# Patient Record
Sex: Female | Born: 2005 | Race: White | Hispanic: No | Marital: Single | Smoking: Never smoker
Health system: Southern US, Community
[De-identification: ages and names within clinical notes are randomized; demographics above are authoritative.]

## PROBLEM LIST (undated history)

## (undated) ENCOUNTER — Emergency Department (HOSPITAL_COMMUNITY): Payer: Self-pay | Source: Home / Self Care

## (undated) DIAGNOSIS — F329 Major depressive disorder, single episode, unspecified: Secondary | ICD-10-CM

## (undated) DIAGNOSIS — F909 Attention-deficit hyperactivity disorder, unspecified type: Secondary | ICD-10-CM

## (undated) DIAGNOSIS — F988 Other specified behavioral and emotional disorders with onset usually occurring in childhood and adolescence: Secondary | ICD-10-CM

## (undated) DIAGNOSIS — Q7963 Vascular Ehlers-Danlos syndrome: Secondary | ICD-10-CM

## (undated) DIAGNOSIS — J45990 Exercise induced bronchospasm: Secondary | ICD-10-CM

## (undated) HISTORY — PX: MENISCUS REPAIR: SHX5179

---
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
## 2024-01-02 ENCOUNTER — Telehealth (INDEPENDENT_AMBULATORY_CARE_PROVIDER_SITE_OTHER): Payer: Self-pay | Admitting: Child and Adolescent Psychiatry

## 2024-01-02 DIAGNOSIS — F649 Gender identity disorder, unspecified: Secondary | ICD-10-CM

## 2024-01-02 DIAGNOSIS — F418 Other specified anxiety disorders: Secondary | ICD-10-CM | POA: Diagnosis not present

## 2024-01-02 DIAGNOSIS — F39 Unspecified mood [affective] disorder: Secondary | ICD-10-CM

## 2024-01-02 NOTE — Progress Notes (Signed)
 Virtual Visit via Video Note  I connected with Samantha [name] on 01/02/24 at 10:30 AM EDT by a video enabled telemedicine application and verified that I am speaking with the correct person using two identifiers.  Location: Patient: home Provider: office   I discussed the limitations of evaluation and management by telemedicine and the availability of in person appointments. The patient expressed understanding and agreed to proceed.   I discussed the assessment and treatment plan with the patient. The patient was provided [name] opportunity to ask questions and all were answered. The patient agreed with the plan and demonstrated [name] understanding of the instructions.   The patient was advised to call back or seek [name] in-person evaluation if the symptoms worsen or if the condition fails to improve as anticipated.     Samantha Smalling, MD   [facility] MD/PA/NP OP Progress Note  01/02/24 3:00 PM Samantha [name]  MRN:  [id number]  Chief Complaint: Medication management follow-up.  Synopsis: This is [name] 18 year old Caucasian assigned female at birth, identifying self as nonbinary and prefers pronoun "they/them", and now prefers to be called "Samantha [name]" domiciled with biological parents and siblings.  They are currently prescribed lamotrigine 50 mg twice daily, trazodone 50 mg at bedtime, BuSpar 10 mg twice a day and Lexapro 20 mg once a day. Past med trials - clonidine for tics caused sedation and no improvement, Abilify (no improvement with tics, but helped with mood, caused weight gain).   HPI:   Samantha [name] was seen and evaluated over telemedicine encounter for medication management for her.  They reported that they have been doing well, they are finishing their freshman year of college, has done most of the schoolwork and therefore the next 2 weeks will be easier and then they will have 3 months of break during which they will move back with their parents.  They reported that their mood has been good,  except occasional situational depressed mood or anxiety, mood and anxiety has stayed stable.  He denied problems with sleep, they are eating better, has improvement with appetite, denied any stomach problems, denied any SI or HI.  They reported that they are doing well socially, they have a good group of friends, also in a relationship with the girlfriend who lives in [place].  They reported that they often take BuSpar 2 times daily because of the classes and despite that their anxiety has stayed stable before we discussed to reduce the dose of BuSpar to 2 times daily.  We discussed to continue with the starter current medications because of the stability with symptoms.  They will follow-up again in about 3 months or earlier if needed.   Visit Diagnosis:    ICD-10-CM   1. Mood disorder (HCC)  F39     2. Other specified anxiety disorders  F41.8 Ambulatory Referral to Primary Care (Establish Care)    3. Gender dysphoria  F64.9                    Past Psychiatric History: One past psychiatric hospitalization, past med trial include Prozac, and clonidine(made them zombie), Abilify - weight gain and not effective for tics, Currently seeing Samantha [name] at [facility] for ind therapy  Psychological evaluation from 06/10/21  "Clinician: Robb Matar, PhD, Licensed Psychologist, Pediatric Neuropsychologist  Reason for Referral   Samantha [name] is a 66 year old right-handed teenager with a history of concern about the development of their attentional system. Additionally, the have diagnoses of anxiety, depression,  obsessive compulsive disorder, Tourette syndrome, and Ehlers Danlos syndrome. Samantha also has some curiosity if they may also have [name] autism spectrum disorder. They were referred for neuropsychological testing due to a long history of difficulty with task completion at home and at school, causing functioning impairment in both arenas.   Key  Findings   Note: Rather than discussing every score, the following is a synopsis of the most important findings from testing. Please refer to the appendix at the end of this report for a complete list of tests administered and their associated scores.  Samantha's profile and history are consistent with attention deficit hyperactivity disorder (ADHD). Many of the stressors at school and at home were reportedly associated with disorganization, poor task completion, and emotional overwhelm. These are all suggestive of difficulty with real world planning, organization, working memory and emotional regulation associated with ADHD. Despite intact performance on several executive functioning measures during assessment, parent ratings real world skill utilization of inhibition, emotional control, working memory, Optician, dispensing, task-monitor, and organization of materials were significantly elevated. As for history of symptoms, Samantha has always been labeled "chatty" at school and Ms. Catalina reported that as a child, they were always on the go. Symptoms of hyperactivity seem to be long standing.   Samantha's presentation and history is also notable a complex constellation of psychiatric symptoms. They report feeling like they are in [name] improved place regarding mood and are currently in counseling and taking several medications. Previously reported "functional collapsing" episodes continue to occur frequently at school and home. Strategies are in place currently and Samantha [name] is on a wait list for treatment out of state to address these. Today, the focus of the evaluation was difficulties impacting learning. ADHD appears to be the most significant factor currently not being addressed. That said, Samantha reports ongoing symptoms of OCD, Anxiety and Tourette syndrome, which often co-occur. Functional impact of these symptoms appears to be manageable with current treatments.   Despite these challenges, Samantha also exhibited  several strengths within their neuropsychological profile.   Intelligence: Samantha's intelligence quotient is overall average, with high average verbal intelligence.    Executive skill demonstration: Samantha [name] demonstrated average to above set shifting, inhibition, and deductive reasoning on assessment tasks. While they don't demonstrat these skills in day to day living, the presence of this skill set suggests that when distractions are limited and the task is outlined and clear, Samantha can be expected to have more success accessing their neurocognitive strengths.   Conclusions   Taken together, Samantha [name] demonstrated age appropriate or better skills on direct assessment today with elevated ratings of their attention and executive functioning across home and school settings. There is no indication on testing of cognitive or learning disorders that could be impacting their success at school. 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 As discussed during the evaluation, the questions of [name] autism spectrum disorder is best evaluated by another clinic and those recommendations are below.   Given the complexity of Samantha's current psychological presentation and ongoing treatment, I am hopeful this additional information with be another important place for intervention and improvement in functioning. It is possible that significant stress and dysfunction from symptoms of ADHD is impacting their overall psychological functioning. It is my hope that with appropriate intervention, Samantha [name] will see improved functioning globally.   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   Family History:  Family History  Problem Relation Age of Onset   Anxiety disorder Mother    Hyperlipidemia Father     Social History:  Social History   Socioeconomic History   Marital status: Single    Spouse name: Not on file   Number of children: Not on file   Years of education: Not on file   Highest education level: Not on file  Occupational History   Not on file  Tobacco Use   Smoking status: Never    Passive exposure: Never   Smokeless tobacco: Never  Vaping Use   Vaping status: Never Used  Substance and Sexual Activity   Alcohol use: No   Drug use: Yes    Types: Marijuana    Comment: Socially   Sexual activity: Not on file  Other Topics Concern   Not on file  Social History Narrative   Not on file   Social Drivers of Health   Financial Resource Strain: Not on file  Food Insecurity: Not on file  Transportation Needs: Not on file  Physical Activity: Not on file  Stress: Not on file  Social Connections: Not on file    Allergies: No Known Allergies  Metabolic Disorder Labs: Lab Results  Component Value Date   HGBA1C 5.3 12/17/2019   No results found for: "PROLACTIN" Lab Results  Component Value Date   CHOL 191 (H) 12/17/2019   TRIG 96 (H) 12/17/2019   HDL 46 12/17/2019   CHOLHDL 4.2 12/17/2019   LDLCALC 128 (H) 12/17/2019   Lab Results  Component Value Date   TSH 1.330 12/17/2019    Therapeutic Level Labs: No results found for: "LITHIUM" No results found for: "VALPROATE" No results found for: "CBMZ"  Current Medications: Current Outpatient Medications  Medication Sig Dispense Refill   busPIRone (BUSPAR) 10 MG tablet Take 1 tablet (10 mg total) by mouth 3 (three) times daily. 270 tablet 1   escitalopram  (LEXAPRO) 20 MG tablet Take 1 tablet (20 mg total) by mouth daily. 90 tablet 1   hydrOXYzine (ATARAX) 25 MG tablet Take 0.5-1 tablets (12.5-25 mg total) by mouth 3 (three) times daily as needed for anxiety. 30 tablet 0   lamoTRIgine (LAMICTAL) 25 MG tablet Take 2 tablets (50 mg total) by mouth 2 (two) times daily. 360 tablet 1   Riboflavin-Magnesium-Feverfew (MIGRELIEF PO) Take 1 tablet by mouth daily.     traZODone (DESYREL) 50 MG tablet TAKE 1 TABLET BY MOUTH EVERYDAY AT BEDTIME 90 tablet 1   XULANE 150-35 MCG/24HR transdermal patch APPLY 1 PATCH AS INSTRUCTED FOR 7 DAYS. USE FOR 3 WEEKS IN A ROW, THEN 1 WEEK BREAK.     No current facility-administered medications for this visit.     Musculoskeletal: Strength & Muscle Tone: unable to assess since visit was over the telemedicine. Gait & Station: unable to assess since visit was over the telemedicine. Patient leans: N/A  Psychiatric Specialty Exam: Review of 12 systems negative except as mentioned in HPI   Mental Status Exam: Appearance: casually dressed; well groomed; no overt signs  of trauma or distress noted Attitude: calm, cooperative with good eye contact Activity: No PMA/PMR, no tics/no tremors; no EPS noted  Speech: normal rate, rhythm and volume Thought Process: Logical, linear, and goal-directed.  Associations: no looseness, tangentiality, circumstantiality, flight of ideas, thought blocking or word salad noted Thought Content: (abnormal/psychotic thoughts): no abnormal or delusional thought process evidenced SI/HI: denies Si/Hi Perception: no illusions or visual/auditory hallucinations noted; no response to internal stimuli demonstrated Mood & Affect: "good"/full range, neutral Judgment & Insight: both fair Attention and Concentration : Good Cognition : WNL Language : Good ADL - Intact    Screenings: GAD-7    Flowsheet Row Video Visit from 05/05/2021 in [facility]  Total  GAD-7 Score 12      PHQ2-9    Flowsheet Row Video Visit from 05/05/2021 in [facility]  PHQ-2 Total Score 3  PHQ-9 Total Score 16      Flowsheet Row ED from 06/09/2023 in [facility] at [facility] ED from 05/11/2023 in [facility] at [facility] ED from 09/06/2021 in [facility]   C-SSRS RISK CATEGORY High Risk High Risk No Risk        Assessment and Plan:   18 year old AFAB, identifies as non binary, prefers pronoun "they/them" with anxiety, mood disorder (most likely bipolar 2 disorder vs MDD), Gender Dysphoria, OCD, Tourette's Syndrome, Sleeping difficulties, one previous psychiatric hospitalization in the context of SI with plan in 2019 currently on Lexapro 20 mg daily, Trazodone 50 mg QHS, Lamictal 50 mg BID, Buspar 10 mg BIDand in weekly to once every other week ind therapy. Neuropsychological evaluation revealed a new diagnosis of ADHD.  Patient is doing well academically and parents are not interested in medication treatment for ADHD.  They have hx of  fainting spells/seizure like activity which now seem to occur very less.  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    Update on 01/02/24  -  Reviewed response to the current medications and they appear to have continued stability with mood and anxiety, tics are stable.recommending to continue with current medications as mentioned regarding the plan.      Plan was reviewed on 01/02/24     #1 Anxiety/OCD (chronic, stable) - Continue Lexapro 20 mg daily - Continue with ind therapy at [facility]. Sees them every 2 weeks - Continue with Buspar 10mg  three times daily. - Continue with Atarax(hydroxyzine) 12.5-25 mg three times daily as needed for anxiety       #2 Mood/Gender Dysphoria  (chronic,stable)  - Continue with Lamictal  50 mg BID.  - Continue with Lexapro 20 mg daily as mentioned above.  - Therapy as mentioned above. Also recommended family therapy once every 3-4 weeks with current therapist.   #3 Tics (chronic, stable) - Tics are better - Continue with therapy  #4 Sleeping difficulties (chronic, stable) - Continue Trazodone 50 mg QHS for sleep.         This note was generated in part or whole with voice recognition software. Voice recognition is usually quite accurate but there are transcription errors that can and very often do occur. I apologize for any typographical errors that were not detected and corrected.   MDM = 2 or more chronic stable conditions + med management  Pilar Bridge, MD 01/02/2024, 10:56 AM

## 2024-04-05 ENCOUNTER — Telehealth: Admitting: Child and Adolescent Psychiatry

## 2024-04-09 ENCOUNTER — Telehealth (INDEPENDENT_AMBULATORY_CARE_PROVIDER_SITE_OTHER): Admitting: Child and Adolescent Psychiatry

## 2024-04-09 DIAGNOSIS — F418 Other specified anxiety disorders: Secondary | ICD-10-CM | POA: Diagnosis not present

## 2024-04-09 DIAGNOSIS — F39 Unspecified mood [affective] disorder: Secondary | ICD-10-CM | POA: Diagnosis not present

## 2024-04-09 DIAGNOSIS — F5105 Insomnia due to other mental disorder: Secondary | ICD-10-CM | POA: Diagnosis not present

## 2024-04-09 DIAGNOSIS — F99 Mental disorder, not otherwise specified: Secondary | ICD-10-CM

## 2024-04-09 DIAGNOSIS — F649 Gender identity disorder, unspecified: Secondary | ICD-10-CM

## 2024-04-09 MED ORDER — ESCITALOPRAM OXALATE 20 MG PO TABS
20.0000 mg | ORAL_TABLET | Freq: Every day | ORAL | 1 refills | Status: DC
Start: 2024-04-09 — End: 2024-07-13

## 2024-04-09 MED ORDER — LAMOTRIGINE 25 MG PO TABS: 50.0000 mg | ORAL_TABLET | Freq: Two times a day (BID) | ORAL | 1 refills | Status: DC

## 2024-04-09 MED ORDER — BUSPIRONE HCL 10 MG PO TABS
10.0000 mg | ORAL_TABLET | Freq: Three times a day (TID) | ORAL | 1 refills | Status: DC
Start: 2024-04-09 — End: 2024-05-24

## 2024-04-09 MED ORDER — TRAZODONE HCL 50 MG PO TABS: ORAL_TABLET | ORAL | 1 refills | Status: DC

## 2024-04-09 NOTE — Progress Notes (Signed)
 Virtual Visit via Telephone Note  I connected with Samantha [name] on 04/09/24 at  2:00 PM EDT by telephone and verified that I am speaking with the correct person using two identifiers.  Location: Patient:  Provider: Office   I discussed the limitations, risks, security and privacy concerns of performing an evaluation and management service by telephone and the availability of in person appointments. I also discussed with the patient that there may be a patient responsible charge related to this service. The patient expressed understanding and agreed to proceed.    I discussed the assessment and treatment plan with the patient. The patient was provided an opportunity to ask questions and all were answered. The patient agreed with the plan and demonstrated an understanding of the instructions.   The patient was advised to call back or seek an in-person evaluation if the symptoms worsen or if the condition fails to improve as anticipated.  I provided 15 minutes of non-face-to-face time during this encounter.   Shelton CHRISTELLA [name], MD    [facility] MD/PA/NP OP Progress Note  04/09/24 3:00 PM Samantha [name]  MRN:  [id number]  Chief Complaint: Medication management follow-up.  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  They are currently prescribed lamotrigine  50 mg twice daily, trazodone  50 mg at bedtime, BuSpar  10 mg twice a day and Lexapro  20 mg once a day. Past med trials - clonidine  for tics caused sedation and no improvement, Abilify  (no improvement with tics, but helped with mood, caused weight gain).   HPI:   [name] was evaluated over telephone encounter for medication management follow-up.  They were out-of-network and therefore could not connect on the video therefore appointment was on the telephone.  They  denied any new concerns for today's appointment, reported that they have been doing well with regards of mood, denied excessive worries or anxiety, reported that they have been sleeping well, denied problems with appetite, denied any new psychosocial stressors.  They have been enjoying time off at home, with their parents.  He denies any SI or HI.  Medically they reported that they believe they have another meniscal tear, and therefore having pain in their knee.  They have MRI scheduled in August, denied any other medical issues.  They reported that they have been taking their medications consistently without any problems.  We discussed to continue with them and follow up again in about 3 to 4 months or earlier if needed.    Visit Diagnosis:    ICD-10-CM   1. Mood disorder (HCC)  F39     2. Other specified anxiety disorders  F41.8 busPIRone  (BUSPAR ) 10 MG tablet    escitalopram  (LEXAPRO ) 20 MG tablet    3. Gender dysphoria  F64.9 escitalopram  (LEXAPRO ) 20 MG tablet    4. Insomnia due to other mental disorder  F51.05 traZODone  (DESYREL ) 50 MG tablet   F99                     Past Psychiatric History: One past psychiatric hospitalization, past med trial include Prozac, and clonidine (made them zombie), Abilify  - weight gain and not effective for tics, Currently seeing Ms. Derry Bon at [facility] for ind therapy  Psychological evaluation from 06/10/21  Clinician: Chiquita Dawn, PhD, Licensed Psychologist, Pediatric Neuropsychologist  Reason for Referral   Samantha [name]) Markovic is a 18 year old  right-handed teenager with a history of concern about the development of their attentional system. Additionally, the have diagnoses of anxiety, depression, obsessive compulsive disorder, Tourette syndrome, and Ehlers Danlos syndrome. Samantha also has some curiosity if they may also have an autism spectrum disorder. They were referred for neuropsychological testing  due to a long history of difficulty with task completion at home and at school, causing functioning impairment in both arenas.   Key Findings   Note: Rather than discussing every score, the following is a synopsis of the most important findings from testing. Please refer to the appendix at the end of this report for a complete list of tests administered and their associated scores.  Samantha's profile and history are consistent with attention deficit hyperactivity disorder (ADHD). Many of the stressors at school and at home were reportedly associated with disorganization, poor task completion, and emotional overwhelm. These are all suggestive of difficulty with real world planning, organization, working memory and emotional regulation associated with ADHD. Despite intact performance on several executive functioning measures during assessment, parent ratings real world skill utilization of inhibition, emotional control, working memory, Optician, dispensing, task-monitor, and organization of materials were significantly elevated. As for history of symptoms, Samantha has always been labeled "chatty" at school and Ms. Mahmood reported that as a child, they were always on the go. Symptoms of hyperactivity seem to be long standing.   Samantha's presentation and history is also notable a complex constellation of psychiatric symptoms. They report feeling like they are in an improved place regarding mood and are currently in counseling and taking several medications. Previously reported "functional collapsing" episodes continue to occur frequently at school and home. Strategies are in place currently and Samantha is on a wait list for treatment out of state to address these. Today, the focus of the evaluation was difficulties impacting learning. ADHD appears to be the most significant factor currently not being addressed. That said, Samantha reports ongoing symptoms of OCD, Anxiety and Tourette syndrome, which often co-occur.  Functional impact of these symptoms appears to be manageable with current treatments.   Despite these challenges, Samantha also exhibited several strengths within their neuropsychological profile.   Intelligence: Samantha's intelligence quotient is overall average, with high average verbal intelligence.    Executive skill demonstration: [name] demonstrated average to above set shifting, inhibition, and deductive reasoning on assessment tasks. While they don't demonstrat these skills in day to day living, the presence of this skill set suggests that when distractions are limited and the task is outlined and clear, Samantha can be expected to have more success accessing their neurocognitive strengths.   Conclusions   Taken together, [name] demonstrated age appropriate or better skills on direct assessment today with elevated ratings of their attention and executive functioning across home and school settings. There is no indication on testing of cognitive or learning disorders that could be impacting their success at school. 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 As discussed during the evaluation, the questions of an autism spectrum disorder is best evaluated by another clinic and those recommendations are below.   Given the complexity of Samantha's current psychological presentation and ongoing treatment, I am hopeful this additional information with be another important place for intervention and improvement in functioning. It is possible that significant stress and dysfunction from symptoms of ADHD is impacting their overall psychological functioning. It is my hope that with appropriate intervention, [name] will see improved functioning globally.   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   Family History:  Family History  Problem Relation Age of Onset   Anxiety disorder Mother    Hyperlipidemia Father     Social History:  Social History   Socioeconomic History   Marital status: Single    Spouse name: Not on file   Number of children: Not on file   Years of education: Not on file   Highest education level: Not on file  Occupational History   Not on file  Tobacco Use   Smoking status: Never    Passive exposure: Never   Smokeless tobacco: Never  Vaping Use   Vaping status: Never Used  Substance and Sexual Activity   Alcohol use: No   Drug use: Yes    Types: Marijuana    Comment: Socially   Sexual activity: Not on file  Other Topics Concern   Not on file  Social History Narrative   Not on file   Social Drivers of Health   Financial Resource Strain: Not on file  Food Insecurity: Not on file  Transportation Needs: Not on file  Physical Activity: Not on file  Stress: Not on file  Social Connections: Not on file    Allergies: No Known Allergies  Metabolic Disorder Labs: Lab Results  Component Value Date   HGBA1C 5.3 12/17/2019   No results found for: PROLACTIN Lab Results  Component Value Date   CHOL 191 (H) 12/17/2019   TRIG 96 (H) 12/17/2019   HDL 46 12/17/2019   CHOLHDL 4.2 12/17/2019   LDLCALC 128 (H) 12/17/2019   Lab Results  Component Value Date   TSH 1.330 12/17/2019    Therapeutic Level Labs: No results found for: LITHIUM No results found for: VALPROATE No results found for: CBMZ  Current Medications: Current Outpatient Medications  Medication Sig  Dispense Refill   busPIRone  (BUSPAR ) 10 MG tablet Take 1 tablet (10 mg total) by mouth 3 (three) times daily. 270 tablet 1   escitalopram  (LEXAPRO ) 20 MG tablet Take 1 tablet (20 mg total) by mouth daily. 90 tablet 1   hydrOXYzine  (ATARAX ) 25 MG tablet Take 0.5-1 tablets (12.5-25 mg total) by mouth 3 (three) times daily as needed for anxiety. 30 tablet 0   lamoTRIgine  (LAMICTAL ) 25 MG tablet Take 2 tablets (50 mg total) by mouth 2 (two) times daily. 360 tablet 1   Riboflavin-Magnesium-Feverfew (MIGRELIEF PO) Take 1 tablet by mouth daily.     traZODone  (DESYREL ) 50 MG tablet TAKE 1 TABLET BY MOUTH EVERYDAY AT BEDTIME 90 tablet 1   XULANE 150-35 MCG/24HR transdermal patch APPLY 1 PATCH AS INSTRUCTED FOR 7 DAYS. USE FOR 3 WEEKS IN A ROW, THEN 1 WEEK BREAK.     No current facility-administered medications for this visit.     Musculoskeletal: Strength & Muscle Tone: unable to assess since visit was over the telemedicine. Gait & Station: unable to assess since visit was over the telemedicine. Patient leans: N/A  Psychiatric Specialty Exam: Review of  12 systems negative except as mentioned in HPI   Mental Status Exam: Appearance: casually dressed; well groomed; no overt signs of trauma or distress noted Attitude: calm, cooperative with good eye contact Activity: No PMA/PMR, no tics/no tremors; no EPS noted  Speech: normal rate, rhythm and volume Thought Process: Logical, linear, and goal-directed.  Associations: no looseness, tangentiality, circumstantiality, flight of ideas, thought blocking or word salad noted Thought Content: (abnormal/psychotic thoughts): no abnormal or delusional thought process evidenced SI/HI: denies Si/Hi Perception: no illusions or visual/auditory hallucinations noted; no response to internal stimuli demonstrated Mood & Affect: good/full range, neutral Judgment & Insight: both fair Attention and Concentration : Good Cognition : WNL Language : Good ADL - Intact     Screenings: GAD-7    Flowsheet Row Video Visit from 05/05/2021 in [facility]  Total GAD-7 Score 12   PHQ2-9    Flowsheet Row Video Visit from 05/05/2021 in [facility]  PHQ-2 Total Score 3  PHQ-9 Total Score 16   Flowsheet Row ED from 06/09/2023 in [facility] at [facility] ED from 05/11/2023 in [facility] from 09/06/2021 in [facility] at [facility]   C-SSRS RISK CATEGORY High Risk High Risk No Risk     Assessment and Plan:   18 year old AFAB, identifies as non binary, prefers pronoun they/them with anxiety, mood disorder (most likely bipolar 2 disorder vs MDD), Gender Dysphoria, OCD, Tourette's Syndrome, Sleeping difficulties, one previous psychiatric hospitalization in the context of SI with plan in 2019 currently on Lexapro  20 mg daily, Trazodone  50 mg QHS, Lamictal  50 mg BID, Buspar  10 mg BIDand in weekly to once every other week ind therapy. Neuropsychological evaluation revealed a new diagnosis of ADHD.  Patient is doing well academically and parents are not interested in medication treatment for ADHD.  They have hx of  fainting spells/seizure like activity which now seem to occur very less.  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    Update on 04/09/24  -  Reviewed response to the current medications and they appear to have continued stability with mood and anxiety, recommending to continue the current medications and follow up again in about 3 to 4 months or earlier as needed.      Plan was reviewed on 04/09/24     #1 Anxiety/OCD (chronic, stable) - Continue Lexapro  20 mg daily - Continue with ind therapy at [facility]. Sees them every 2 weeks - Continue with Buspar  10mg  three  times daily. - Continue with Atarax (hydroxyzine ) 12.5-25 mg three times daily as needed for anxiety       #2 Mood/Gender Dysphoria (chronic,stable)  - Continue with Lamictal   50 mg BID.  - Continue with Lexapro  20 mg daily as mentioned above.  - Therapy as mentioned above. Also recommended family therapy once every 3-4 weeks with current therapist.   #3 Tics (chronic, stable) - Tics are better - Continue with therapy  #4 Sleeping difficulties (chronic, stable) - Continue Trazodone  50 mg QHS for sleep.         This note was generated in part or whole with voice recognition software. Voice recognition is usually quite accurate but there are transcription errors that can and very often do occur. I apologize for any typographical errors that were not detected and corrected.  MDM = 2 or more chronic stable conditions + med management         Shelton CHRISTELLA [name], MD 04/09/2024, 2:24 PM

## 2024-05-18 ENCOUNTER — Ambulatory Visit: Admitting: Nurse Practitioner

## 2024-05-18 ENCOUNTER — Encounter: Payer: Self-pay | Admitting: Nurse Practitioner

## 2024-05-18 VITALS — BP 98/62 | HR 70 | Resp 16 | Ht 62.75 in | Wt 172.5 lb

## 2024-05-18 DIAGNOSIS — Z1159 Encounter for screening for other viral diseases: Secondary | ICD-10-CM

## 2024-05-18 DIAGNOSIS — F952 Tourette's disorder: Secondary | ICD-10-CM

## 2024-05-18 DIAGNOSIS — F4323 Adjustment disorder with mixed anxiety and depressed mood: Secondary | ICD-10-CM

## 2024-05-18 DIAGNOSIS — E282 Polycystic ovarian syndrome: Secondary | ICD-10-CM

## 2024-05-18 DIAGNOSIS — F39 Unspecified mood [affective] disorder: Secondary | ICD-10-CM | POA: Diagnosis not present

## 2024-05-18 DIAGNOSIS — Z131 Encounter for screening for diabetes mellitus: Secondary | ICD-10-CM

## 2024-05-18 DIAGNOSIS — Z8249 Family history of ischemic heart disease and other diseases of the circulatory system: Secondary | ICD-10-CM | POA: Insufficient documentation

## 2024-05-18 DIAGNOSIS — Z13220 Encounter for screening for lipoid disorders: Secondary | ICD-10-CM

## 2024-05-18 DIAGNOSIS — Z114 Encounter for screening for human immunodeficiency virus [HIV]: Secondary | ICD-10-CM

## 2024-05-18 DIAGNOSIS — G90A Postural orthostatic tachycardia syndrome (POTS): Secondary | ICD-10-CM

## 2024-05-18 DIAGNOSIS — F902 Attention-deficit hyperactivity disorder, combined type: Secondary | ICD-10-CM | POA: Insufficient documentation

## 2024-05-18 DIAGNOSIS — S83232A Complex tear of medial meniscus, current injury, left knee, initial encounter: Secondary | ICD-10-CM | POA: Insufficient documentation

## 2024-05-18 DIAGNOSIS — Z130 Encounter for screening for diseases of the blood and blood-forming organs and certain disorders involving the immune mechanism: Secondary | ICD-10-CM

## 2024-05-18 DIAGNOSIS — Q7963 Vascular Ehlers-Danlos syndrome: Secondary | ICD-10-CM

## 2024-05-18 DIAGNOSIS — S83232S Complex tear of medial meniscus, current injury, left knee, sequela: Secondary | ICD-10-CM

## 2024-05-18 NOTE — Progress Notes (Signed)
 BP 98/62   Pulse 70   Resp 16   Ht 5' 2.75 (1.594 m)   Wt 172 lb 8 oz (78.2 kg)   SpO2 100%   BMI 30.80 kg/m    Subjective:    Patient ID: Samantha [name], adult    DOB: 27-May-2006, 18 y.o.   MRN: [id number]  HPI: Samantha [name] is a 18 y.o. adult  Chief Complaint  Patient presents with   Establish Care    Req handicap due to meniscus    Discussed the use of AI scribe software for clinical note transcription with the patient, who gave verbal consent to proceed.  History of Present Illness Samantha [name] is an 18 year old who presents to establish care.  Neuropsychiatric symptoms - Tourette's disorder, mood disorder, and adjustment disorder managed with Buspar  10 mg three times daily, Lexapro  20 mg daily, Lamictal  50 mg twice daily, hydroxyzine  12.5 to 25 mg three times daily as needed, and trazodone  50 mg at bedtime - Currently under psychiatric and behavioral health care at [facility] - Currently seeing a psychiatrist and therapist  Left knee pain and suspected meniscal injury - Under orthopedic care for suspected medial meniscus tear in the left knee - MRI did not confirm a meniscal tear - Scheduled for arthroscopic procedure for further evaluation and potential treatment  Connective tissue disease and associated symptoms - Diagnosed with Ehlers-Danlos syndrome in 2021 or 2022 - Chronic syncopal episodes and mobility issues attributed to Ehlers-Danlos syndrome - Symptoms of postural orthostatic tachycardia syndrome (POTS) - Family history of Ehlers-Danlos syndrome affecting mother and sister, but not brother - Family history of hereditary aneurysms affecting mother and sister; great grandmother died from aneurysm  Gynecologic and endocrine symptoms - IUD inserted under sedation earlier this week - History of irregular menstrual periods - Previously evaluated for polycystic ovary syndrome (PCOS), which was not confirmed at that time - Recent findings during  IUD insertion suggest possible PCOS - Currently under care of an OBGYN  Care coordination - Seeking to connect with a cardiologist and neurologist for further evaluation of their conditions         05/18/2024    2:20 PM 05/05/2021   10:19 AM  Depression screen PHQ 2/9  Decreased Interest 0   Down, Depressed, Hopeless 0   PHQ - 2 Score 0   Altered sleeping 0   Tired, decreased energy 0   Change in appetite 0   Feeling bad or failure about yourself  0   Trouble concentrating 0   Moving slowly or fidgety/restless 0   Suicidal thoughts 0   PHQ-9 Score 0   Difficult doing work/chores Not difficult at all      Information is confidential and restricted. Go to Review Flowsheets to unlock data.       05/18/2024    2:20 PM 05/05/2021   10:24 AM  GAD 7 : Generalized Anxiety Score  Nervous, Anxious, on Edge 0   Control/stop worrying 0   Worry too much - different things 0   Trouble relaxing 0   Restless 0   Easily annoyed or irritable 0   Afraid - awful might happen 0   Total GAD 7 Score 0   Anxiety Difficulty Not difficult at all      Information is confidential and restricted. Go to Review Flowsheets to unlock data.     Relevant past medical, surgical, family and social history reviewed and updated as indicated. Interim medical history since our last  visit reviewed. Allergies and medications reviewed and updated.  Review of Systems  Constitutional: Negative for fever or weight change.  Respiratory: Negative for cough and shortness of breath.   Cardiovascular: Negative for chest pain or palpitations.  Gastrointestinal: Negative for abdominal pain, no bowel changes.  Musculoskeletal: positive for gait problem or joint swelling.  Skin: Negative for rash.  Neurological: Negative for dizziness or headache.  No other specific complaints in a complete review of systems (except as listed in HPI above).      Objective:     BP 98/62   Pulse 70   Resp 16   Ht 5' 2.75  (1.594 m)   Wt 172 lb 8 oz (78.2 kg)   SpO2 100%   BMI 30.80 kg/m    Wt Readings from Last 3 Encounters:  05/18/24 172 lb 8 oz (78.2 kg) (93%, Z= 1.50)*  06/09/23 167 lb (75.8 kg) (93%, Z= 1.44)*  05/11/23 172 lb 6.4 oz (78.2 kg) (94%, Z= 1.56)*   * Growth percentiles are based on CDC (Girls, 2-20 Years) data.    Physical Exam Physical Exam GENERAL: Alert, cooperative, well developed, no acute distress HEENT: Normocephalic, normal oropharynx, moist mucous membranes CHEST: Clear to auscultation bilaterally, no wheezes, rhonchi, or crackles CARDIOVASCULAR: Normal heart rate and rhythm, S1 and S2 normal without murmurs ABDOMEN: Soft, non-tender, non-distended, without organomegaly, normal bowel sounds EXTREMITIES: No cyanosis or edema NEUROLOGICAL: Cranial nerves grossly intact, moves all extremities without gross motor or sensory deficit   Results for orders placed or performed during the hospital encounter of 05/11/23  Comprehensive metabolic panel   Collection Time: 05/11/23 11:16 AM  Result Value Ref Range   Sodium 138 135 - 145 mmol/L   Potassium 3.8 3.5 - 5.1 mmol/L   Chloride 103 98 - 111 mmol/L   CO2 24 22 - 32 mmol/L   Glucose, Bld 91 70 - 99 mg/dL   BUN 5 4 - 18 mg/dL   Creatinine, Ser 9.04 0.50 - 1.00 mg/dL   Calcium 9.9 8.9 - 89.6 mg/dL   Total Protein 7.6 6.5 - 8.1 g/dL   Albumin 3.8 3.5 - 5.0 g/dL   AST 19 15 - 41 U/L   ALT 15 0 - 44 U/L   Alkaline Phosphatase 85 47 - 119 U/L   Total Bilirubin 0.2 (L) 0.3 - 1.2 mg/dL   GFR, Estimated NOT CALCULATED >60 mL/min   Anion gap 11 5 - 15  Salicylate level   Collection Time: 05/11/23 11:16 AM  Result Value Ref Range   Salicylate Lvl <7.0 (L) 7.0 - 30.0 mg/dL  Acetaminophen  level   Collection Time: 05/11/23 11:16 AM  Result Value Ref Range   Acetaminophen  (Tylenol ), Serum <10 (L) 10 - 30 ug/mL  Ethanol   Collection Time: 05/11/23 11:16 AM  Result Value Ref Range   Alcohol, Ethyl (B) <10 <10 mg/dL  CBC with  Diff   Collection Time: 05/11/23 11:16 AM  Result Value Ref Range   WBC 10.3 4.5 - 13.5 K/uL   RBC 5.10 3.80 - 5.70 MIL/uL   Hemoglobin 15.2 12.0 - 16.0 g/dL   HCT 54.0 63.9 - 50.9 %   MCV 90.0 78.0 - 98.0 fL   MCH 29.8 25.0 - 34.0 pg   MCHC 33.1 31.0 - 37.0 g/dL   RDW 87.2 88.5 - 84.4 %   Platelets 389 150 - 400 K/uL   nRBC 0.0 0.0 - 0.2 %   Neutrophils Relative % 74 %   Neutro  Abs 7.7 1.7 - 8.0 K/uL   Lymphocytes Relative 20 %   Lymphs Abs 2.1 1.1 - 4.8 K/uL   Monocytes Relative 4 %   Monocytes Absolute 0.4 0.2 - 1.2 K/uL   Eosinophils Relative 1 %   Eosinophils Absolute 0.1 0.0 - 1.2 K/uL   Basophils Relative 0 %   Basophils Absolute 0.0 0.0 - 0.1 K/uL   Immature Granulocytes 1 %   Abs Immature Granulocytes 0.05 0.00 - 0.07 K/uL  hCG, serum, qualitative   Collection Time: 05/11/23 11:16 AM  Result Value Ref Range   Preg, Serum NEGATIVE NEGATIVE  Urine rapid drug screen (hosp performed)   Collection Time: 05/11/23 11:22 AM  Result Value Ref Range   Opiates NONE DETECTED NONE DETECTED   Cocaine NONE DETECTED NONE DETECTED   Benzodiazepines NONE DETECTED NONE DETECTED   Amphetamines NONE DETECTED NONE DETECTED   Tetrahydrocannabinol POSITIVE (A) NONE DETECTED   Barbiturates NONE DETECTED NONE DETECTED          Assessment & Plan:   Problem List Items Addressed This Visit       Cardiovascular and Mediastinum   POTS (postural orthostatic tachycardia syndrome)   Relevant Orders   Ambulatory referral to Cardiology   TSH     Endocrine   PCOS (polycystic ovarian syndrome)     Nervous and Auditory   Tourette disorder     Musculoskeletal and Integument   Vascular Ehlers-Danlos syndrome   Relevant Orders   Ambulatory referral to Cardiology   Ambulatory referral to Neurology   Complex tear of medial meniscus of left knee     Other   Mood disorder (HCC) - Primary   Adjustment disorder with mixed anxiety and depressed mood   Attention deficit hyperactivity  disorder (ADHD), combined type   Family history of aneurysm   Relevant Orders   Ambulatory referral to Cardiology   Ambulatory referral to Neurology   Other Visit Diagnoses       Need for hepatitis C screening test       Relevant Orders   Hepatitis C antibody     Screening for HIV without presence of risk factors       Relevant Orders   HIV Antibody (routine testing w rflx)     Screening for deficiency anemia       Relevant Orders   CBC with Differential/Platelet     Screening for cholesterol level       Relevant Orders   Lipid panel     Screening for diabetes mellitus       Relevant Orders   Comprehensive metabolic panel with GFR   Hemoglobin A1c        Assessment and Plan Assessment & Plan Vascular Ehlers-Danlos syndrome Diagnosed in 2021 or 2022 with symptoms of increased bruising and bleeding tendencies. Family history follows the matrilineal line. Family hx of aneurysms.  Referral placed to cardiology  Complex tear of medial meniscus, left knee Currently under orthopedic care. MRI results were inconclusive. An arthroscopic procedure is planned to assess and potentially address the tear. Mobility issues and bleeding tendencies associated with Ehlers-Danlos syndrome necessitate a handicap placard. - Fill out handicap placard form for [employer].  Postural orthostatic tachycardia syndrome (POTS) Symptoms present with a family history in the mother and sister. Recent IUD insertion was performed under sedation due to POTS. - Refer to cardiologist.  Tourette's disorder, mood disorder, and adjustment disorder with mixed anxiety and depressed mood Managed by psychiatry with multiple medications including Buspar ,  Lexapro , and Lamictal . Regular follow-up with a psychiatrist and therapist. - Order lab work to assess current health status. - Refer to neurologist.  Establish care Getting labs Referral placed      Follow up plan: Return in about 1 year (around 05/18/2025) for  cpe.

## 2024-05-19 LAB — LIPID PANEL
Cholesterol: 260 mg/dL — ABNORMAL HIGH (ref <170)
HDL: 52 mg/dL (ref >45)
LDL Cholesterol (Calc): 166 mg/dL — ABNORMAL HIGH (ref <110)
Non-HDL Cholesterol (Calc): 208 mg/dL — ABNORMAL HIGH (ref <120)
Total CHOL/HDL Ratio: 5 (calc) — ABNORMAL HIGH (ref <5.0)
Triglycerides: 257 mg/dL — ABNORMAL HIGH (ref <90)

## 2024-05-19 LAB — CBC WITH DIFFERENTIAL/PLATELET
Absolute Lymphocytes: 2640 {cells}/uL (ref 1200–5200)
Absolute Monocytes: 528 {cells}/uL (ref 200–900)
Basophils Absolute: 40 {cells}/uL (ref 0–200)
Basophils Relative: 0.5 %
Eosinophils Absolute: 8 {cells}/uL — ABNORMAL LOW (ref 15–500)
Eosinophils Relative: 0.1 %
HCT: 40.3 % (ref 34.0–46.0)
Hemoglobin: 13.7 g/dL (ref 11.5–15.3)
MCH: 30.4 pg (ref 25.0–35.0)
MCHC: 34 g/dL (ref 31.0–36.0)
MCV: 89.4 fL (ref 78.0–98.0)
MPV: 10.2 fL (ref 7.5–12.5)
Monocytes Relative: 6.6 %
Neutro Abs: 4784 {cells}/uL (ref 1800–8000)
Neutrophils Relative %: 59.8 %
Platelets: 322 Thousand/uL (ref 140–400)
RBC: 4.51 Million/uL (ref 3.80–5.10)
RDW: 13 % (ref 11.0–15.0)
Total Lymphocyte: 33 %
WBC: 8 Thousand/uL (ref 4.5–13.0)

## 2024-05-19 LAB — COMPREHENSIVE METABOLIC PANEL WITH GFR
AG Ratio: 1.6 (calc) (ref 1.0–2.5)
ALT: 14 U/L (ref 5–32)
AST: 15 U/L (ref 12–32)
Albumin: 4.4 g/dL (ref 3.6–5.1)
Alkaline phosphatase (APISO): 74 U/L (ref 36–128)
BUN/Creatinine Ratio: 14 (calc) (ref 6–22)
BUN: 14 mg/dL (ref 7–20)
CO2: 29 mmol/L (ref 20–32)
Calcium: 9.8 mg/dL (ref 8.9–10.4)
Chloride: 101 mmol/L (ref 98–110)
Creat: 0.97 mg/dL — ABNORMAL HIGH (ref 0.50–0.96)
Globulin: 2.7 g/dL (ref 2.0–3.8)
Glucose, Bld: 89 mg/dL (ref 65–99)
Potassium: 4.2 mmol/L (ref 3.8–5.1)
Sodium: 138 mmol/L (ref 135–146)
Total Bilirubin: 0.4 mg/dL (ref 0.2–1.1)
Total Protein: 7.1 g/dL (ref 6.3–8.2)
eGFR: 87 mL/min/1.73m2 (ref >=60)

## 2024-05-19 LAB — HEMOGLOBIN A1C
Hgb A1c MFr Bld: 5.3 % (ref <5.7)
Mean Plasma Glucose: 105 mg/dL
eAG (mmol/L): 5.8 mmol/L

## 2024-05-19 LAB — TSH: TSH: 2.01 m[IU]/L

## 2024-05-22 ENCOUNTER — Ambulatory Visit: Payer: Self-pay | Admitting: Nurse Practitioner

## 2024-05-22 NOTE — Progress Notes (Unsigned)
 Cardiology Office Note   Date:  05/24/2024  ID:  Samantha [name], DOB [birth year], MRN [id number] PCP: Gareth Mliss FALCON, FNP  [facility] Providers Cardiologist:  Caron Poser, MD     History of Present Illness Samantha [name] is a 18 y.o. 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  Patient presents with a long history of positional palpitations and dizziness. reports intermittently will have syncope as well.  Says sister has been diagnosed with POTS.  Denies any family history of SCD.  Patient reports both that mother and sister have been diagnosed with vascular Ehlers-Danlos syndrome.  Patient's mother and sister reportedly both have intracerebral aneurysms that are small.  Patient reports that grandmother passed away from a ruptured intracerebral aneurysm around age 68.  Last LDL 166 04/2024.  Relevant CVD History -Normal screening CTA chest abdomen pelvis and CTA head 11/2020 [facility]) -TTE 10/2020 normal biventricular function without significant valvular disease ([facility]) -Benign Zio monitor 09/2020 with rare PACs/PVCs, no significant arrhythmias, and all triggers corresponding to sinus rhythm   ROS: Pt denies any chest discomfort, jaw pain, arm pain, palpitations, syncope, presyncope, orthopnea, PND, or LE edema.  Studies Reviewed I have independently reviewed the patient's ECG, previous cardiac testing, previous imaging from [facility], recent blood work, and prior medical records.  Physical Exam VS:  BP 112/74 (BP Location: Right Arm, Patient Position: Sitting, Cuff Size: Normal)   Pulse (!) 50   Ht 5' 2 (1.575 m)   Wt 167 lb (75.8 kg)   LMP 04/18/2024 (Approximate)   SpO2 99%   BMI 30.54 kg/m   Orthostatic VS for the past 24 hrs (Last 3 readings):  BP- Lying Pulse- Lying BP- Sitting Pulse- Sitting BP- Standing at 0 minutes Pulse- Standing at 0 minutes BP-  Standing at 3 minutes Pulse- Standing at 3 minutes  05/24/24 1414 106/64 (!) 48 99/66 60 135/74 89 104/70 67      Wt Readings from Last 3 Encounters:  05/24/24 167 lb (75.8 kg) (92%, Z= 1.38)*  05/18/24 172 lb 8 oz (78.2 kg) (93%, Z= 1.50)*  06/09/23 167 lb (75.8 kg) (93%, Z= 1.44)*   * Growth percentiles are based on CDC (Girls, 2-20 Years) data.    GEN: No acute distress. NECK: No JVD; No carotid bruits. CARDIAC: RRR, no murmurs, rubs, gallops. RESPIRATORY:  Clear to auscultation. EXTREMITIES:  Warm and well-perfused. No edema.  ASSESSMENT AND PLAN Palpitation Dizziness Patient presents with a long history of positional palpitations and dizziness.  Orthostatic vital signs are normal in office today and below the usual threshold for a POTS diagnosis.  There is occasional syncope.  Symptoms are mostly tolerable currently.  Plan: - Echo to rule out structural heart disease - ZIO monitor to rule out arrhythmia or IST - Discussed conservative measures including counterpressure maneuvers, compression stockings, liberalizing salt intake, and a graduated exercise program.  Patient reports they would like to avoid any medications for now.  I advised that if symptoms become untenable, we can discuss pharmacologic options following the results of cardiac testing  Significantly elevated LDL Patient's LDL is 166 which is quite high for a 18 year old.  Levels are not quite FH but this could represent early trends in that direction.  Patient reports that father has high cholesterol.  While I do not think we need to initiate treatment just yet, I did recommend FH genetic testing as it will have implications for patient in  the future as well as for immediate family members  Plan: - Referral to genetics; if a diagnosis of FH is confirmed, then we can start lipid-lowering therapy  History of vascular Ehlers-Danlos syndrome History of vascular Ehlers-Danlos and intracerebral aneurysms in sister,  mother, and grandmother.  The grandmother reportedly passed away in her early 41s from rupture of cerebral aneurysm.  Mother and sister reportedly have small intracerebral aneurysms currently that are too small for intervention. Baseline testing 3 years ago at [facility] with CTA was unremarkable.  Plan: - Intermittent MRA for surveillance of vascular beds; will obtain baseline testing today with MR angiogram head, neck, chest abdomen pelvis - Further surveillance pending results and input from neurology and cardiovascular genetics        Dispo: RTC 1 year  Signed, Caron Poser, MD

## 2024-05-24 ENCOUNTER — Other Ambulatory Visit: Payer: Self-pay | Admitting: Child and Adolescent Psychiatry

## 2024-05-24 ENCOUNTER — Ambulatory Visit

## 2024-05-24 VITALS — BP 112/74 | HR 50 | Ht 62.0 in | Wt 167.0 lb

## 2024-05-24 DIAGNOSIS — R002 Palpitations: Secondary | ICD-10-CM

## 2024-05-24 DIAGNOSIS — R55 Syncope and collapse: Secondary | ICD-10-CM

## 2024-05-24 DIAGNOSIS — Q7963 Vascular Ehlers-Danlos syndrome: Secondary | ICD-10-CM | POA: Diagnosis not present

## 2024-05-24 DIAGNOSIS — F418 Other specified anxiety disorders: Secondary | ICD-10-CM

## 2024-05-24 DIAGNOSIS — E782 Mixed hyperlipidemia: Secondary | ICD-10-CM | POA: Diagnosis not present

## 2024-05-24 NOTE — Patient Instructions (Signed)
 Medication Instructions:  Your physician recommends that you continue on your current medications as directed. Please refer to the Current Medication list given to you today.  *If you need a refill on your cardiac medications before your next appointment, please call your pharmacy*  Lab Work:  No labs ordered today   If you have labs (blood work) drawn today and your tests are completely normal, you will receive your results only by: MyChart Message (if you have MyChart) OR A paper copy in the mail If you have any lab test that is abnormal or we need to change your treatment, we will call you to review the results.  Testing/Procedures:  Your physician has requested that you have an echocardiogram. Echocardiography is a painless test that uses sound waves to create images of your heart. It provides your doctor with information about the size and shape of your heart and how well your heart's chambers and valves are working.   You may receive an ultrasound enhancing agent through an IV if needed to better visualize your heart during the echo. This procedure takes approximately one hour.  There are no restrictions for this procedure.  This will take place at [address] [facility] [address]) [address], [place] [address]  Please note: We ask at that you not bring children with you during ultrasound (echo/ vascular) testing. Due to room size and safety concerns, children are not allowed in the ultrasound rooms during exams. Our front office staff cannot provide observation of children in our lobby area while testing is being conducted. An adult accompanying a patient to their appointment will only be allowed in the ultrasound room at the discretion of the ultrasound technician under special circumstances. We apologize for any inconvenience.   Your physician has recommended that you wear a Zio monitor.   This monitor is a medical device that records the heart's electrical activity. Doctors  most often use these monitors to diagnose arrhythmias. Arrhythmias are problems with the speed or rhythm of the heartbeat. The monitor is a small device applied to your chest. You can wear one while you do your normal daily activities. While wearing this monitor if you have any symptoms to push the button and record what you felt. Once you have worn this monitor for the period of time provider prescribed (Usually 14 days), you will return the monitor device in the postage paid box. Once it is returned they will download the data collected and provide us  with a report which the provider will then review and we will call you with those results. Important tips:  Avoid showering during the first 24 hours of wearing the monitor. Avoid excessive sweating to help maximize wear time. Do not submerge the device, no hot tubs, and no swimming pools. Keep any lotions or oils away from the patch. After 24 hours you may shower with the patch on. Take brief showers with your back facing the shower head.  Do not remove patch once it has been placed because that will interrupt data and decrease adhesive wear time. Push the button when you have any symptoms and write down what you were feeling. Once you have completed wearing your monitor, remove and place into box which has postage paid and place in your outgoing mailbox.  If for some reason you have misplaced your box then call our office and we can provide another box and/or mail it off for you.   MRI Angiogram of Head, Neck, Chest, Abdomen and Pelvis  Magnetic Resonance Angiogram Magnetic resonance imaging (MRI) is a painless test that produces images of the inside of the body without using X-rays. During an MRI, strong magnets and radio waves work together in a Data processing manager to form detailed images. MRI images may provide more details about a medical condition than X-rays, CT scans, and ultrasounds can provide. A magnetic resonance angiogram (MRA) is an MRI  done on your blood vessels. During an MRA, dye (contrast material or contrast dye) is injected into your body to make the images even clearer. An MRA provides images of blood vessels and blood flow. It can be used to help diagnose and treat heart disorders, stroke, and blood vessel diseases. Tell a health care provider about: Any surgeries you have had. Any bleeding problems you have. Any allergies you have. This is especially important if you have had a previous allergic reaction to contrast dye or iodine. Any metal you may have in your body. The magnets used in an MRA can cause metal objects in your body to move. Metal can also make it hard to get high-quality images. Objects that may contain metal include: Any joint replacement (prosthesis), such as an artificial knee or hip. An implanted defibrillator, pacemaker, or neurostimulator. A metallic ear implant (cochlear implant). An artificial heart valve or vascular stents, filters, or coils. A metallic object in the eye socket. Metal splinters or bullet fragments. A port for delivering insulin or chemotherapy. Any medical conditions you have, especially if you have kidney problems. All medicines you are taking, including vitamins, herbs, eye drops, creams, and over-the-counter medicines. Any tattoos you have. Some red dyes contain iron, which can cause problems with testing. Whether you are pregnant, may be pregnant, or are breastfeeding. Any fear of cramped spaces (claustrophobia). If this is a problem, it usually can be relieved with medicines. What are the risks? Generally, this is a safe test. However, problems can occur: If you have metal in your body, it may be affected by the magnets used during the test. If you have a metallic implant close to the area being tested, it may be hard to get high-quality images. If you are pregnant, you should avoid MRI tests, including MRAs, during the first 3 months of pregnancy. An MRI may have effects  on an unborn baby. If you are breastfeeding, you may need to stop temporarily. Your breast milk may contain contrast material until the material naturally leaves your body. Other possible problems include: Pain or irritation at the IV insertion site. Allergic reaction to the contrast dye. A rare reaction called nephrogenic systemic fibrosis that causes damage to skin and other organs. What happens before the procedure? You will be asked to leave all jewelry at home. You may be asked to wear a gown. If you are breastfeeding, follow instructions from your health care provider about pumping before your test and stopping breastfeeding temporarily. You will be asked to remove all metal, including: Any watches or other metal objects, like hair pins. Hearing aids or dentures. Mobile phone. Underwire bra. Makeup. Certain kinds of makeup contain small amounts of metal. Dental braces and fillings are normally not a problem. What happens during the procedure?  You may be given earplugs or headphones to listen to music. The machine can be noisy. An IV will be inserted into one of your veins. Contrast material will be injected into your IV. You will lie down on a platform, similar to a long table. The platform will slide into a long tunnel  that has magnets inside of it. When you are inside the tunnel, you will still be able to talk to your health care provider. You will be asked to lie very still while images are taken. Your health care provider will tell you when you can move. You may have to wait a few minutes to make sure that the images produced during the test are clear. When all images are produced, the platform will slide out of the tunnel. The procedure may vary among health care providers and hospitals. What can I expect after the procedure? If you are breastfeeding, do not breastfeed your child until your health care provider says that this is safe. It is up to you to get your test results.  Ask your health care provider, or the department that is doing the test, when your results will be ready. Follow these instructions at home: Activity If you were given a sedative during the procedure, it can affect you for several hours. Do not drive or operate machinery until your health care provider says that it is safe. Return to your normal activities as told by your health care provider. Ask your health care provider what activities are safe for you. General instructions Check your IV insertion area every day for signs of infection. Check for: Redness, swelling, or pain. Fluid or blood. Warmth. Pus or a bad smell. The contrast material will leave your body through your urine within a day. You may be told to drink plenty of fluids to help flush the contrast material out of your system. Keep all follow-up visits. This is important. Contact a health care provider if: You have any symptoms of allergy to the contrast dye. These include: Shortness of breath. Rash or itchy, red, swollen areas of skin (hives). A racing heartbeat. You have redness, swelling, warmth, pus, or pain around the IV insertion site. Summary Magnetic resonance imaging (MRI) is a painless test that uses strong magnets and radio waves working together in a magnetic field to form very detailed and sharp images. An MRA provides images of blood vessels and blood flow. Before your MRA, be sure to tell your health care provider about any metal you may have in your body, any allergies, if you have kidney problems, or if you are pregnant or breastfeeding. Be sure to tell your health care provider if you have any fear of cramped spaces (claustrophobia). You may be told to drink plenty of fluids to help flush the contrast material out of your system. This information is not intended to replace advice given to you by your health care provider. Make sure you discuss any questions you have with your health care provider. Document  Revised: 01/12/2024 Document Reviewed: 01/20/2021 Elsevier Patient Education  2025 [employer].   Follow-Up:  Referral to [facility] with Dr. Fairy Haring, [place], [place]   At [facility], you and your health needs are our priority.  As part of our continuing mission to provide you with exceptional heart care, our providers are all part of one team.  This team includes your primary Cardiologist (physician) and Advanced Practice Providers or APPs (Physician Assistants and Nurse Practitioners) who all work together to provide you with the care you need, when you need it.  Your next appointment:   1 year(s)  Provider:   Caron Poser, MD   We recommend signing up for the patient portal called MyChart.  Sign up information is provided on this After Visit Summary.  MyChart is used to  connect with patients for Virtual Visits (Telemedicine).  Patients are able to view lab/test results, encounter notes, upcoming appointments, etc.  Non-urgent messages can be sent to your provider as well.   To learn more about what you can do with MyChart, go to [email or web address].   Other Instructions     Guidelines for Patients with POTS  DIETARY MEASURES: The initial treatment strategy includes making sure to get adequate hydration and sodium. - Oral fluid intake should be encouraged to a target of 3 L daily (100oz). - In addition to water, you can choose drinks higher in sodium. Fluids with extra salt include tomato juice, tomato soup or warm broth. Drinks high in electrolytes, such as low calorie G2T, Powerade ZeroT, and PropelT, can also be helpful but be careful to avoid excess sugar. Make water your number one drink. - Eating smaller meals more often, and avoiding large heavy meals, may be helpful. - Stimulants such as coffee, tea, energy drinks, certain fizzy drinks and alcohol may make symptoms worse, as can refined sugar, carbohydrates and dairy products - keep track of your  triggers. - Aim for a daily salt intake of 8-10g of sodium chloride. You can do this in the form of added salt to food, intake of higher sodium foods, and sports drinks with sodium - Don't over-rely on processed foods. Processed foods are easy to prepare and are appealing when you have reduced energy, but usually have less nutritional value. Beneficial salty snacks may include chicken or beef broth, vegetable broth, pickles, olives, salted fish like sardines/anchovies and nuts. Don't over-rely on snack chips and crackers for salt. - If you are having a hard time getting your salt in by foods, talk to your healthcare provider about sodium tablets. - Diet with high fiber and complex carbohydrates may help reduce blood glucose (sugar) spikes and lessen POTS symptoms. - Drink water and have something salty about 30 minutes before getting out of bed in the morning, if your dizziness is worse in the morning.  COMPRESSION STOCKINGS - Thigh high or waist high compression stockings can be helpful to alleviate the symptoms - seek out ones that are 30-39mmHg on the label. - Some people have tried athletic knee high compression stockings (can be purchased  online or from sporting good stores). These often come in more colors/patterns. Although not as strong as medical compression stockings, these can be effective in helping with symptoms in some people.  EXERCISE -  Studies have shown that one of the most important things a patient with POTS can do is exercise. Many patients with POTS are physically deconditioned and others are at risk for deconditioning by restricting their physical activity to manage POTS symptoms. - Start very slowly, exercise 5 minutes twice per day. Every week increase your exercise time by 3 minutes. Most patients with POTS feel better doing exercises that are recumbent (sitting down, such as using a stationary bike, rowing machine, or swimming).  - Gradually work your way up to a regular  exercise program and incorporate walking. - Remember to stay hydrated and cool during your work-out. - Yoga and pilates have also suggested to be helpful.  REGULAR MOVEMENT - Isometric exercises involve contracting your muscles without actually moving your body. Isometrics squeeze the muscle and push the blood back toward the heart. They are simple to do and can be done lying in bed or seated in a comfortable chair. It's a good idea to do these in bed before getting up to prepare  your body for sitting and standing. - Transition slowly with your body. Go from lying to sitting on the edge of the bed. Stay there for several minutes, allowing the body to naturally adjust to the change in position. Once you are standing, pause and wait before walking to allow blood pressure to adjust again. If you feel lightheaded at any point, wait for a few minutes in that position to see if it resolves. If not, then return to the prior position as your body isn't adjusting properly. SLOWLY is the key.  SLEEP - Try to maintain a typical sleep schedule. Go to bed consistently at a certain time and set a consistent time to wake up. The best sleep hygiene and good rest comes from staying consistent with your sleep schedule every day.  - Most people need 7 to 10 hours sleep at night. - You can try raising the head of your bed 6-10 inches to help alleviate POTS symptoms. The entire bed must be at an angle. Raising the head of the bed will reduce urine formation overnight and increase fluid volume in your circulation in the morning.   BIRTH CONTROL MEDICATIONS - Certain specific birth control pills may exacerbate symptoms - this includes medicines like Yaz or Yasmin that specifically contain drosperinone as the progestin ingredient. Do not stop your medication without talking to the prescriber.   HELPFUL EXERCISE PROGRAM Microsoft Word - [email or web address]_Exercise_Program.docx  POTSIE APP POTSie - An App for POTS  Syndrome

## 2024-06-11 ENCOUNTER — Encounter: Payer: Self-pay | Admitting: Nurse Practitioner

## 2024-06-11 ENCOUNTER — Ambulatory Visit (INDEPENDENT_AMBULATORY_CARE_PROVIDER_SITE_OTHER): Admitting: Nurse Practitioner

## 2024-06-11 VITALS — BP 112/72 | HR 92 | Temp 98.1°F | Resp 18 | Ht 62.0 in | Wt 171.2 lb

## 2024-06-11 DIAGNOSIS — Q7963 Vascular Ehlers-Danlos syndrome: Secondary | ICD-10-CM | POA: Diagnosis not present

## 2024-06-11 DIAGNOSIS — S83232S Complex tear of medial meniscus, current injury, left knee, sequela: Secondary | ICD-10-CM | POA: Diagnosis not present

## 2024-06-11 DIAGNOSIS — S83232D Complex tear of medial meniscus, current injury, left knee, subsequent encounter: Secondary | ICD-10-CM | POA: Diagnosis not present

## 2024-06-11 NOTE — Progress Notes (Signed)
 SPORTS MEDICINE POSTOPERATIVE VISIT  DATE OF PROCEDURE: 06/01/2024  PROCEDURE PERFORMED: Left knee arthroscopy, EUA indicating failed primary ACL reconstruction, Examination of prior meniscal allograft which was stable.   Prior surgeries include:  09/02/23 - Left knee ACL reconstruction with meniscal allograft  12/09/21 Left knee total medial menisectomy   ASSESSMENT    Samantha [name] is a 18 y.o. adult  s/p above  PLAN:  --1. I am recommending a revision ACL reconstruction to include a lateral extra-articular tenodesis - likely in December following the end of her semester at [employer]  2. I am prescribing an ACL sports brace for use now and also after next surgery 3. The patient will likely work with me at [facility] practice.   --Imaging findings, further diagnostic and therapeutic options were reviewed with the patient, along with the benefits and drawbacks of each, and the patient expressed understanding  Prescriptions today: None  Follow-up: TBD   X-rays at next visit:  None   SUBJECTIVE    History of Present Illness: 18 y.o. adult who presents for evaluation following recent arthroscopy and debridement.  Little to no pain from surgery.  Has had increased pain with climbing stairs.    Surgical Hx Past Surgical History[1]    OBJECTIVE   Physical Exam:  DETAILED PHYSICAL EXAM General Appearance well-nourished and no acute distress  Vitals Estimated body mass index is 29.76 kg/m as calculated from the following:   Height as of 06/01/24: 159.4 cm (5' 2.75).   Weight as of 06/01/24: 75.6 kg (166 lb 10.7 oz).  Mood and Affect alert, cooperative, and pleasant     Cardiovascular well-perfused distally and no swelling   MUSCULOSKELETAL  LEFT:    KNEE:   Incisions CDI -  ROM is good  Some clicking -      Imaging/other tests: none new   Orthopaedic PROMIS: Failed to redirect to the Timeline version of the REVFS SmartLink.     03/14/2023  04/04/2023 06/27/2023  [facility]  Lower Extremity Physical Function CAT Score 29 35 35  Pain Interference CAT Score 29 28 24      DME ORDER: Dx: D16.717I, S83.232D, S83.512D, M23.92, Acute lateral meniscus tear of left knee, subsequent encounter, Complex tear of medial meniscus of left knee as current injury, subsequent encounter, Rupture of anterior cruciate ligament of left knee, subsequent encounter, Acute internal derangement of left knee Location: [address] Laterality: Left Body Location: Hip and Knee Hip and Knee Orthotics: Off the Shelf ACL  DME Lower Extremity, The patient was prescribed this orthosis The patient is ambulatory but has weakness and/or instability of their lower extremity which requires stabilization from this semi-rigid/ rigid orthosis to improve their function.                  For the diagnosis of left knee ACL tear the patient was prescribed a ACL sports brace.  The patient is ambulatory but has weakness and/or instability of their left lower extremity which requires stabilization from this semi-rigid/ rigid orthosis to improve their function.       [1] Past Surgical History: Procedure Laterality Date  . 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 PR KNEE SCOPE,AID ANT CRUCIATE REPAIR Left 09/02/2023   Procedure: ARTHROSCOPICALLY AIDED ANTERIOR CRUCIATE LIGAMENT REPAIR/AUGMENTATION OR RECONSTRUCTION;  Surgeon: Courtenay,  Reyes Kansky, MD;  Location: OR [facility]  Service: Orthopedics  . PR KNEE SCOPE,MED/LAT MENISECTOMY Left 12/09/2021   Procedure: ARTHROSCOPY, KNEE; W/MENISECT(MED/LAT, INCL MENISCAL SHAVE) W/DEBRIDE/SHAVE ARTICULAR CART(CHONDROPLASTY);  Surgeon: Reyes Kansky Raymond, MD;  Location: [facility];  Service:  Orthopedics  . PR KNEE SCOPE,MED/LAT MENISECTOMY Left 09/02/2023   Procedure: ARTHROSCOPY, KNEE; W/MENISECT(MED/LAT, INCL MENISCAL SHAVE) W/DEBRIDE/SHAVE ARTICULAR CART(CHONDROPLASTY);  Surgeon: Raymond Reyes Kansky, MD;  Location: OR [facility];  Service: Orthopedics  . PR KNEE SCOPE,PART SYNOVECT Left 06/01/2024   Procedure: ARTHROSCOPY, KNEE, SURGICAL; SYNOVECTOMY LIMITED (EG, PLICA OR SHELF RESECTION) (SEPARATE PROCEDURE);  Surgeon: Raymond Reyes Kansky, MD;  Location: OR [facility];  Service: Orthopedics  . TEAR DUCT SURGERY

## 2024-06-11 NOTE — Progress Notes (Signed)
 BP 112/72   Pulse 92   Temp 98.1 F (36.7 C)   Resp 18   Ht 5' 2 (1.575 m)   Wt 171 lb 3.2 oz (77.7 kg)   LMP 04/18/2024 (Approximate)   SpO2 98%   BMI 31.31 kg/m    Subjective:    Patient ID: Samantha [name], adult    DOB: 20-Dec-2005, 18 y.o.   MRN: [id number]  HPI: Samantha [name] is a 18 y.o. adult  Chief Complaint  Patient presents with   Gait Problem   Discussed the use of AI scribe software for clinical note transcription with the patient, who gave verbal consent to proceed.  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  Impaired mobility - Worsening mobility secondary to left meniscus tear and Ehlers-Danlos syndrome - Increased difficulty with ambulation and movement - Requesting a wheelchair to aid in mobility -currently uses a cane,  feels like her mobility is getting worse  Left knee instability and pain - Recent exploratory surgical procedure performed last week to assess knee condition and plan for future transplant - Persistent instability of the anterior cruciate ligament (ACL) - Another surgical intervention being scheduled today  Cardiac monitoring - In contact with cardiology for ongoing evaluation - Scheduled to be fitted with a heart monitor today         05/18/2024    2:20 PM 05/05/2021   10:19 AM  Depression screen PHQ 2/9  Decreased Interest 0   Down, Depressed, Hopeless 0   PHQ - 2 Score 0   Altered sleeping 0   Tired, decreased energy 0   Change in appetite 0   Feeling bad or failure about yourself  0   Trouble concentrating 0   Moving slowly or fidgety/restless 0   Suicidal thoughts 0   PHQ-9 Score 0   Difficult doing work/chores Not difficult at all      Information is confidential and restricted. Go to Review Flowsheets to unlock data.    Relevant past medical, surgical, family and  social history reviewed and updated as indicated. Interim medical history since our last visit reviewed. Allergies and medications reviewed and updated.  Review of Systems  Ten systems reviewed and is negative except as mentioned in HPI      Objective:      BP 112/72   Pulse 92   Temp 98.1 F (36.7 C)   Resp 18   Ht 5' 2 (1.575 m)   Wt 171 lb 3.2 oz (77.7 kg)   LMP 04/18/2024 (Approximate)   SpO2 98%   BMI 31.31 kg/m    Wt Readings from Last 3 Encounters:  06/11/24 171 lb 3.2 oz (77.7 kg) (93%, Z= 1.47)*  05/24/24 167 lb (75.8 kg) (92%, Z= 1.38)*  05/18/24 172 lb 8 oz (78.2 kg) (93%, Z= 1.50)*   * Growth percentiles are based on CDC (Girls, 2-20 Years) data.    Physical Exam GENERAL: Alert, cooperative, well developed, no acute distress HEENT: Normocephalic, normal oropharynx, moist mucous membranes CHEST: Clear to auscultation bilaterally, No wheezes, rhonchi, or crackles CARDIOVASCULAR: Normal heart rate and rhythm, S1 and S2 normal without murmurs ABDOMEN: Soft, non-tender, non-distended, without organomegaly, Normal bowel sounds EXTREMITIES: No cyanosis or edema NEUROLOGICAL: Cranial nerves grossly intact, Moves all extremities without gross motor or sensory deficit  Results for orders placed or performed in visit on 05/18/24  CBC with Differential/Platelet   Collection Time: 05/18/24  2:52 PM  Result Value Ref Range   WBC 8.0 4.5 - 13.0 Thousand/uL   RBC 4.51 3.80 - 5.10 Million/uL   Hemoglobin 13.7 11.5 - 15.3 g/dL   HCT 59.6 65.9 - 53.9 %   MCV 89.4 78.0 - 98.0 fL   MCH 30.4 25.0 - 35.0 pg   MCHC 34.0 31.0 - 36.0 g/dL   RDW 86.9 88.9 - 84.9 %   Platelets 322 140 - 400 Thousand/uL   MPV 10.2 7.5 - 12.5 fL   Neutro Abs 4,784 1,800 - 8,000 cells/uL   Absolute Lymphocytes 2,640 1,200 - 5,200 cells/uL   Absolute Monocytes 528 200 - 900 cells/uL   Eosinophils Absolute 8 (L) 15 - 500 cells/uL   Basophils Absolute 40 0 - 200 cells/uL   Neutrophils Relative %  59.8 %   Total Lymphocyte 33.0 %   Monocytes Relative 6.6 %   Eosinophils Relative 0.1 %   Basophils Relative 0.5 %  Comprehensive metabolic panel with GFR   Collection Time: 05/18/24  2:52 PM  Result Value Ref Range   Glucose, Bld 89 65 - 99 mg/dL   BUN 14 7 - 20 mg/dL   Creat 9.02 (H) 9.49 - 0.96 mg/dL   eGFR 87 > OR = 60 fO/fpw/8.26f7   BUN/Creatinine Ratio 14 6 - 22 (calc)   Sodium 138 135 - 146 mmol/L   Potassium 4.2 3.8 - 5.1 mmol/L   Chloride 101 98 - 110 mmol/L   CO2 29 20 - 32 mmol/L   Calcium 9.8 8.9 - 10.4 mg/dL   Total Protein 7.1 6.3 - 8.2 g/dL   Albumin 4.4 3.6 - 5.1 g/dL   Globulin 2.7 2.0 - 3.8 g/dL (calc)   AG Ratio 1.6 1.0 - 2.5 (calc)   Total Bilirubin 0.4 0.2 - 1.1 mg/dL   Alkaline phosphatase (APISO) 74 36 - 128 U/L   AST 15 12 - 32 U/L   ALT 14 5 - 32 U/L  Lipid panel   Collection Time: 05/18/24  2:52 PM  Result Value Ref Range   Cholesterol 260 (H) <170 mg/dL   HDL 52 >54 mg/dL   Triglycerides 742 (H) <90 mg/dL   LDL Cholesterol (Calc) 166 (H) <110 mg/dL (calc)   Total CHOL/HDL Ratio 5.0 (H) <5.0 (calc)   Non-HDL Cholesterol (Calc) 208 (H) <120 mg/dL (calc)  Hemoglobin J8r   Collection Time: 05/18/24  2:52 PM  Result Value Ref Range   Hgb A1c MFr Bld 5.3 <5.7 %   Mean Plasma Glucose 105 mg/dL   eAG (mmol/L) 5.8 mmol/L  TSH   Collection Time: 05/18/24  2:52 PM  Result Value Ref Range   TSH 2.01 mIU/L          Assessment & Plan:   Problem List Items Addressed This Visit       Musculoskeletal and Integument   Vascular Ehlers-Danlos syndrome   Relevant Orders   DME Wheelchair manual   Complex tear of medial meniscus of left knee - Primary   Relevant Orders   DME Wheelchair manual     Assessment and Plan Assessment & Plan Left meniscus tear with ACL instability, left knee Left meniscus tear with associated ACL instability. Worsening mobility reported. - Order wheelchair for mobility assistance - Schedule follow-up surgery to  address ACL instability with plate placement  Ehlers-Danlos syndrome Ehlers-Danlos syndrome contributing to mobility issues. Cardiac monitoring initiated. - Place heart monitor for cardiac evaluation  Follow up plan: Return if symptoms worsen or fail to improve.

## 2024-06-11 NOTE — Telephone Encounter (Signed)
 Called patient to advise that the Zio can be started as soon as they can get home to pick the monitor up - application was delayed due to surgery

## 2024-06-12 ENCOUNTER — Other Ambulatory Visit: Payer: Self-pay

## 2024-07-03 ENCOUNTER — Ambulatory Visit: Attending: Genetic Counselor | Admitting: Genetic Counselor

## 2024-07-03 ENCOUNTER — Other Ambulatory Visit: Payer: Self-pay

## 2024-07-03 DIAGNOSIS — E782 Mixed hyperlipidemia: Secondary | ICD-10-CM

## 2024-07-09 NOTE — Progress Notes (Signed)
 Pre-test Genetic Consult notes   Referral Reason Samantha [name] was referred for genetic consult and testing of familial hypercholesterolemia (FH).  Personal Medical Information Samantha [name] (III.2 on pedigree) is a 17-year Caucasian who is currently at [employer] and plays the piano. She is asymptomatic but recent lipid panel detected elevated LDL-C of 166 mg/dl.  Risk Factors  Samantha [name] denies having renal or hepatic dysfunction or hypothyroidism that can also lead to elevated LDL-C. Also denies having an atherogenic diet, smoking, diabetes or hypertension.   Family history Samantha [name] reports a significant family history of vascular Ehlers Danlos Syndrome. Her mother (II.3) had an intracranial aneurysm that was detected when she was evaluated for hemiplegic migraines. 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  Her maternal great grandmother (not indicated in pedigree) died of a brain aneurysm at 46. The familial COL3A1 variant has been subsequently detected in her maternal grandmother (I.4), age 38 and older sister (III.1), age 57. Her younger brother (III.3) tested negative for this variant.  Relation to Proband Pedigree # Current age Heart condition/age of onset Notes        Sister  III.1 22 None Normal LDL-C (106)  Brother III.3 16 None         Father II.2 32 Hyperlipidemia @ 43 Controlled with statins  Paternal uncle II.1 56 None   Paternal grandfather 1.1 Deceased None Died @ ? of prostate cancer  Paternal grandmother I.2 37 None Heart valve replaced        Mother II.3 67 Vascular EDS   Maternal uncle II.4 28 ? Does not go to the doctor  Maternal grandfather 1.3 ? ? estranged  Maternal grandmother I.4 24 None Joint hypermobility   Genetic Consultation Notes  I explained to the patient that the characteristic features of a genetic condition include absence of risk factors that can lead to elevated LDL-C, early age  of presentation, increased disease severity and family history of the condition. The clinical manifestations of FH were also reviewed. Patient denies having xanthomas, corneal arcus or a heart attack.  I discussed the molecular pathogenesis of FH. I informed the patient that FH is primarily caused by pathogenic variants in three genes, namely APOB, LDLR and PCSK9. These pathogenic variants impact LDLR synthesis, degradation and recycling in cells and results in elevated LDL-C levels. We then walked through autosomal dominant inheritance pattern and viewed pedigree of families with heterozygous FH (HeFH) and homozygous FH (HoFH). Explained to him that digenic or compound heterozygous mutations in APOB, LDLR and PCSK9 genes can cause HoFH.   We reviewed the likely outcomes of FH genetic testing. Based on the diagnostic criteria for FH, yields can range from 50%-90%. A positive yield is observed in ~63% of patients with a definite clinical diagnosis of FH. A negative test does not exclude a genetic basis for FH. In some cases, polygenic inheritance, where more than an average number of common variants, each with small effect, are known to increase plasma lipid levels. Additionally, limitations in current genetic testing methodology can produce a negative result. Variants of unknown significance (VUS) can be seen in some cases. I explained that typically a VUS is so classified if the variant is not well understood as very few individuals have been reported to harbor  this variant or its role in gene function has not been elucidated. Screening other first-degree family members by genetic testing was also discussed. Additionally, we briefly touched upon the molecular basis of the different  treatment modalities that are currently available.  Impression  In summary, Samantha [name] had elevated LDL-C in the absence of risk factors at a very young age. Additionally, she reports family history of hyperlipidemia in her father.  It is highly likely that she inherited this condition from her father. Genetic testing is recommended and should evaluate the major genes implicated in familial hypercholesterolemia, namely LDLR, APOB, PCSK9.    In addition, we discussed the protections afforded by the Genetic Information Non-Discrimination Act (GINA). I explained to her that GINA protects a patient from losing their employment or health insurance based on their genotype. However, these protections do not cover life insurance and disability. Patient verbalized understanding of this.  Please note that the patient has not been counseled in this visit on other personal, cultural or ethical issues that they may face due to their heart condition.   Plans After a thorough discussion of the risk and benefits of genetic testing for FH, Samantha [name] states her intent to pursue genetic testing for FH and signed the informed consent form. Blood was drawn today for testing.    Danford Pac, Ph.D, [facility] Clinical Molecular Geneticist

## 2024-07-10 ENCOUNTER — Ambulatory Visit

## 2024-07-10 DIAGNOSIS — Q7963 Vascular Ehlers-Danlos syndrome: Secondary | ICD-10-CM

## 2024-07-10 DIAGNOSIS — R55 Syncope and collapse: Secondary | ICD-10-CM | POA: Diagnosis not present

## 2024-07-10 DIAGNOSIS — R002 Palpitations: Secondary | ICD-10-CM | POA: Diagnosis not present

## 2024-07-10 LAB — ECHOCARDIOGRAM COMPLETE
Area-P 1/2: 3.5 cm2
S' Lateral: 3.1 cm

## 2024-07-11 ENCOUNTER — Ambulatory Visit: Payer: Self-pay

## 2024-07-11 NOTE — Telephone Encounter (Signed)
 Please call pt and try to get this accomplished. Since she is 22, maybe she could see another MD in the office. Also inform her about [facility]

## 2024-07-11 NOTE — Telephone Encounter (Signed)
 Changing meds is not something I typically do over the phone. It looks like we have a couple openings on Friday, Joliza please call to schedule a virtual visit

## 2024-07-13 ENCOUNTER — Encounter (HOSPITAL_COMMUNITY): Payer: Self-pay | Admitting: Psychiatry

## 2024-07-13 ENCOUNTER — Telehealth (INDEPENDENT_AMBULATORY_CARE_PROVIDER_SITE_OTHER): Admitting: Psychiatry

## 2024-07-13 DIAGNOSIS — F411 Generalized anxiety disorder: Secondary | ICD-10-CM

## 2024-07-13 DIAGNOSIS — F952 Tourette's disorder: Secondary | ICD-10-CM | POA: Diagnosis not present

## 2024-07-13 DIAGNOSIS — F32A Depression, unspecified: Secondary | ICD-10-CM

## 2024-07-13 DIAGNOSIS — F649 Gender identity disorder, unspecified: Secondary | ICD-10-CM

## 2024-07-13 DIAGNOSIS — F418 Other specified anxiety disorders: Secondary | ICD-10-CM

## 2024-07-13 MED ORDER — ESCITALOPRAM OXALATE 20 MG PO TABS: 20.0000 mg | ORAL_TABLET | Freq: Every day | ORAL | 1 refills | Status: AC

## 2024-07-13 MED ORDER — LAMOTRIGINE 100 MG PO TABS: 100.0000 mg | ORAL_TABLET | Freq: Two times a day (BID) | ORAL | 2 refills | Status: DC

## 2024-07-13 MED ORDER — BUSPIRONE HCL 10 MG PO TABS: 10.0000 mg | ORAL_TABLET | Freq: Three times a day (TID) | ORAL | 1 refills | Status: AC

## 2024-07-13 MED ORDER — GUANFACINE HCL 1 MG PO TABS: 1.0000 mg | ORAL_TABLET | Freq: Two times a day (BID) | ORAL | 2 refills | Status: DC

## 2024-07-13 NOTE — Progress Notes (Signed)
 Virtual Visit via Video Note  I connected with Samantha [name] on 07/13/24 at  9:40 AM EDT by a video enabled telemedicine application and verified that I am speaking with the correct person using two identifiers.  Location: Patient: home Provider: office   I discussed the limitations of evaluation and management by telemedicine and the availability of in person appointments. The patient expressed understanding and agreed to proceed.      I discussed the assessment and treatment plan with the patient. The patient was provided an opportunity to ask questions and all were answered. The patient agreed with the plan and demonstrated an understanding of the instructions.   The patient was advised to call back or seek an in-person evaluation if the symptoms worsen or if the condition fails to improve as anticipated.  I provided 20 minutes of non-face-to-face time during this encounter.   Barnie Gull, MD  [facility] MD/PA/NP OP Progress Note  07/13/2024 10:51 AM Samantha [name]  MRN:  [id number]  Chief Complaint:  Chief Complaint  Patient presents with   Depression   Anxiety   Follow-up   HPI: This patient is an 18 year old nonbinary person assigned female at birth but prefers they/them pronouns.  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  They do have family in [place].  The patient has been seeing Dr. Marcille who is currently on vacation.  They had called the office requesting a help with significant anxiety and depersonalization symptoms.  I therefore decided to see see them in a video visit.  The patient states that they have been in therapy since around age 21.  They have been diagnosed with Tourette's syndrome.  They did go through specific therapy for this but lately the symptoms have worsened.  They are having more facial tics and vocalizations.  They have also been feeling more derealization as a feeling of being  outside the body.  They also have been diagnosed with ADHD but are not on any medications for this.  They also been thinking they could have bipolar 2 disorder.  They describe having either a flattened mood or feeling down but never any euphoria or manic symptoms.  They deny any thoughts of self-harm or suicide.  The patient is facing a surgery on their knee which resulted from a figure skating accident.  They are worried about the success of the upcoming surgery  Given all of the different symptoms I asked the patient what is the main concern today.  They responded that it is the feeling of derealization and the tics and the mood swings.  Therefore we decided to increase Lamictal  from 50 mg twice daily to 100 mg twice daily.  We will also add guanfacine 1 mg twice daily to help with the tics and mood swings.  For now we will continue Lexapro  trazodone  and BuSpar  but may address increasing or changing Lexapro  later as the patient also complains of obsessional thinking. Visit Diagnosis:    ICD-10-CM   1. Tourette disorder  F95.2     2. Other specified anxiety disorders  F41.8 busPIRone  (BUSPAR ) 10 MG tablet    escitalopram  (LEXAPRO ) 20 MG tablet    3. Gender dysphoria  F64.9 escitalopram  (LEXAPRO ) 20 MG tablet      Past Psychiatric History: One past psychiatric hospitalization, past med trial include Prozac, and clonidine (made them zombie), Abilify  - weight gain and not effective for tics, Currently seeing Ms. Cris Tuff at [facility] for ind therapy  Past Medical History:  Past Medical History:  Diagnosis Date   ADD (attention deficit disorder)    ADHD    Exercise-induced asthma    MDD (major depressive disorder)    Vascular Ehlers-Danlos syndrome     Past Surgical History:  Procedure Laterality Date   MENISCUS REPAIR      Family Psychiatric History: See below  Family History:  Family History  Problem Relation Age of Onset   Anxiety disorder Mother     Polycystic ovary syndrome Mother    Hyperlipidemia Father     Social History:  Social History   Socioeconomic History   Marital status: Single    Spouse name: Not on file   Number of children: Not on file   Years of education: Not on file   Highest education level: Not on file  Occupational History   Not on file  Tobacco Use   Smoking status: Never    Passive exposure: Never   Smokeless tobacco: Never  Vaping Use   Vaping status: Never Used  Substance and Sexual Activity   Alcohol use: Yes    Comment: occasionally   Drug use: Yes    Types: Marijuana    Comment: Socially   Sexual activity: Not Currently    Birth control/protection: I.U.D.  Other Topics Concern   Not on file  Social History Narrative   Not on file   Social Drivers of Health   Financial Resource Strain: Not on file  Food Insecurity: Not on file  Transportation Needs: Not on file  Physical Activity: Inactive (05/18/2024)   Exercise Vital Sign    Days of Exercise per Week: 0 days    Minutes of Exercise per Session: 0 min  Stress: Stress Concern Present (05/18/2024)   [employer] of Occupational Health - Occupational Stress Questionnaire    Feeling of Stress: To some extent  Social Connections: Not on file    Allergies: No Known Allergies  Metabolic Disorder Labs: Lab Results  Component Value Date   HGBA1C 5.3 05/18/2024   MPG 105 05/18/2024   No results found for: PROLACTIN Lab Results  Component Value Date   CHOL 260 (H) 05/18/2024   TRIG 257 (H) 05/18/2024   HDL 52 05/18/2024   CHOLHDL 5.0 (H) 05/18/2024   LDLCALC 166 (H) 05/18/2024   LDLCALC 128 (H) 12/17/2019   Lab Results  Component Value Date   TSH 2.01 05/18/2024   TSH 1.330 12/17/2019    Therapeutic Level Labs: No results found for: LITHIUM No results found for: VALPROATE No results found for: CBMZ  Current Medications: Current Outpatient Medications  Medication Sig Dispense Refill   guanFACINE (TENEX) 1  MG tablet Take 1 tablet (1 mg total) by mouth 2 (two) times daily. 60 tablet 2   lamoTRIgine  (LAMICTAL ) 100 MG tablet Take 1 tablet (100 mg total) by mouth 2 (two) times daily. 60 tablet 2   busPIRone  (BUSPAR ) 10 MG tablet Take 1 tablet (10 mg total) by mouth 3 (three) times daily. 180 tablet 1   diclofenac (VOLTAREN) 75 MG EC tablet Take 75 mg by mouth 2 (two) times daily. (Patient not taking: Reported on 06/11/2024)     escitalopram  (LEXAPRO ) 20 MG tablet Take 1 tablet (20 mg total) by mouth daily. 90 tablet 1   hydrOXYzine  (ATARAX ) 25 MG tablet Take 0.5-1 tablets (12.5-25 mg total) by mouth 3 (three) times daily as needed for anxiety. 30 tablet 0   levonorgestrel (KYLEENA) 19.5  MG IUD 1 each by Intrauterine route once.     Riboflavin-Magnesium-Feverfew (MIGRELIEF PO) Take 1 tablet by mouth daily.     traZODone  (DESYREL ) 50 MG tablet TAKE 1 TABLET BY MOUTH EVERYDAY AT BEDTIME 90 tablet 1   No current facility-administered medications for this visit.     Musculoskeletal: Strength & Muscle Tone: within normal limits Gait & Station: normal Patient leans: N/A  Psychiatric Specialty Exam: Review of Systems  Psychiatric/Behavioral:  Positive for decreased concentration. The patient is nervous/anxious.   All other systems reviewed and are negative.   There were no vitals taken for this visit.There is no height or weight on file to calculate BMI.  General Appearance: Casual and Fairly Groomed  Eye Contact:  Good  Speech:  Clear and Coherent  Volume:  Normal  Mood: Anxious  Affect:  Congruent  Thought Process:  Goal Directed  Orientation:  Full (Time, Place, and Person)  Thought Content: Obsessions and Rumination   Suicidal Thoughts:  No  Homicidal Thoughts:  No  Memory:  Immediate;   Good Recent;   Good Remote;   Good  Judgement:  Good  Insight:  Good  Psychomotor Activity:  Normal  Concentration:  Concentration: Fair and Attention Span: Fair  Recall:  Good  Fund of Knowledge:  Good  Language: Good  Akathisia:  No  Handed:  Right  AIMS (if indicated): not done  Assets:  Communication Skills Desire for Improvement Physical Health Resilience Social Support Talents/Skills Vocational/Educational  ADL's:  Intact  Cognition: WNL  Sleep:  Good   Screenings: GAD-7    Flowsheet Row [facility] Visit from [facility]/18/2024 in [facility] Visit from 05[facility]05/20[facility]  Total GAD-7 Score 0 12   PHQ2-9    Flowsheet Row Office Visit from 05/18/2024 in [facility] Video Visit from 05/05/2021 in [facility]  PHQ-2 Total Score 0 3  PHQ-9 Total Score 0 16   Flowsheet Row ED from 06/09/2023 in [facility] Emergency Department at [facility] ED from 05/11/2023 in [facility] Emergency Department at [facility] from 09/06/2021 in [facility]   C-SSRS RISK CATEGORY High Risk High Risk No Risk     Assessment and Plan: This patient is an 18 year old nonbinary individual with a past history of possible bipolar disorder Tourette's syndrome, generalized anxiety OCD.  Their current regimen does not seem to be effective.  Because of the mood swings we will increase Lamictal  to 100 mg twice daily.  We will add guanfacine 1 mg twice daily to address tics and depersonalization.  They will continue Lexapro  20 mg daily for depression for now and BuSpar  10 mg 3 times daily for anxiety.  Since Dr. Charletta Speaker has taken on a new role  theywill return to see me in 4 weeks  Collaboration of Care: Collaboration of Care: Primary Care Provider AEB notes will be shared with PCP at patient's request  Patient/Guardian was advised Release of Information must be obtained prior to any record release in order to collaborate their care with an outside provider. Patient/Guardian was advised if they have not already done so to contact  the registration department to sign all necessary forms in order for us  to release information regarding their care.   Consent: Patient/Guardian gives verbal consent for treatment and assignment of benefits for services provided during this visit. Patient/Guardian expressed understanding and agreed to  proceed.    Barnie Gull, MD 07/13/2024, 10:51 AM

## 2024-07-26 ENCOUNTER — Ambulatory Visit: Admitting: Nurse Practitioner

## 2024-07-26 NOTE — Progress Notes (Deleted)
   There were no vitals taken for this visit.   Subjective:    Patient ID: Samantha [name], adult    DOB: 2006/02/25, 18 y.o.   MRN: [id number]  HPI: Samantha [name] is a 18 y.o. adult with a history of vascular ehlers-danlos,anxiety, functional neurological symptom disorder, ADHD, Tourette's Syndrome, POTS and PCOS who presents today with concerns for increased migraines and seizure like activity. She was previously followed by Neurology in 2023 for Tourette's Syndrome but has not been seen since. When patient was seen on 05/18/2024 Neurology referral was placed. Patient has not followed up    Migraines:   Seizures:           05/18/2024    2:20 PM 05/05/2021   10:19 AM  Depression screen PHQ 2/9  Decreased Interest 0   Down, Depressed, Hopeless 0   PHQ - 2 Score 0   Altered sleeping 0   Tired, decreased energy 0   Change in appetite 0   Feeling bad or failure about yourself  0   Trouble concentrating 0   Moving slowly or fidgety/restless 0   Suicidal thoughts 0   PHQ-9 Score 0   Difficult doing work/chores Not difficult at all      Information is confidential and restricted. Go to Review Flowsheets to unlock data.    Relevant past medical, surgical, family and social history reviewed and updated as indicated. Interim medical history since our last visit reviewed. Allergies and medications reviewed and updated.  Review of Systems  Per HPI unless specifically indicated above     Objective:     There were no vitals taken for this visit.  {Vitals History (Optional):23777} Wt Readings from Last 3 Encounters:  06/11/24 171 lb 3.2 oz (77.7 kg) (93%, Z= 1.47)*  05/24/24 167 lb (75.8 kg) (92%, Z= 1.38)*  05/18/24 172 lb 8 oz (78.2 kg) (93%, Z= 1.50)*   * Growth percentiles are based on CDC (Girls, 2-20 Years) data.    Physical Exam   Results for orders placed or performed in visit on 07/10/24  ECHOCARDIOGRAM COMPLETE   Collection Time: 07/10/24  3:53 PM  Result  Value Ref Range   S' Lateral 3.10 cm   Area-P 1/2 3.50 cm2   Est EF 55 - 60%    {Labs (Optional):23779}       Assessment & Plan:   Problem List Items Addressed This Visit   None    Assessment and Plan         Follow up plan: No follow-ups on file.

## 2024-07-30 ENCOUNTER — Telehealth: Admitting: Child and Adolescent Psychiatry

## 2024-07-30 ENCOUNTER — Other Ambulatory Visit: Payer: Self-pay

## 2024-07-30 ENCOUNTER — Emergency Department (HOSPITAL_COMMUNITY)
Admission: EM | Admit: 2024-07-30 | Discharge: 2024-07-30 | Disposition: A | Discharge status: Home / Self Care | Attending: Emergency Medicine | Admitting: Emergency Medicine

## 2024-07-30 DIAGNOSIS — F418 Other specified anxiety disorders: Secondary | ICD-10-CM | POA: Diagnosis not present

## 2024-07-30 DIAGNOSIS — F952 Tourette's disorder: Secondary | ICD-10-CM

## 2024-07-30 DIAGNOSIS — F5105 Insomnia due to other mental disorder: Secondary | ICD-10-CM

## 2024-07-30 DIAGNOSIS — G479 Sleep disorder, unspecified: Secondary | ICD-10-CM | POA: Diagnosis not present

## 2024-07-30 DIAGNOSIS — F422 Mixed obsessional thoughts and acts: Secondary | ICD-10-CM

## 2024-07-30 DIAGNOSIS — J45909 Unspecified asthma, uncomplicated: Secondary | ICD-10-CM | POA: Insufficient documentation

## 2024-07-30 DIAGNOSIS — R569 Unspecified convulsions: Secondary | ICD-10-CM | POA: Diagnosis present

## 2024-07-30 DIAGNOSIS — F429 Obsessive-compulsive disorder, unspecified: Secondary | ICD-10-CM

## 2024-07-30 DIAGNOSIS — F649 Gender identity disorder, unspecified: Secondary | ICD-10-CM | POA: Diagnosis not present

## 2024-07-30 LAB — COMPREHENSIVE METABOLIC PANEL WITH GFR
ALT: 22 U/L (ref 0–44)
AST: 24 U/L (ref 15–41)
Albumin: 3.5 g/dL (ref 3.5–5.0)
Alkaline Phosphatase: 71 U/L (ref 38–126)
Anion gap: 15 (ref 5–15)
BUN: 9 mg/dL (ref 6–20)
CO2: 20 mmol/L — ABNORMAL LOW (ref 22–32)
Calcium: 8.6 mg/dL — ABNORMAL LOW (ref 8.9–10.3)
Chloride: 107 mmol/L (ref 98–111)
Creatinine, Ser: 0.71 mg/dL (ref 0.44–1.00)
GFR, Estimated: >60 mL/min (ref >60)
Glucose, Bld: 84 mg/dL (ref 70–99)
Potassium: 3.5 mmol/L (ref 3.5–5.1)
Sodium: 142 mmol/L (ref 135–145)
Total Bilirubin: 0.2 mg/dL (ref 0.0–1.2)
Total Protein: 6.2 g/dL — ABNORMAL LOW (ref 6.5–8.1)

## 2024-07-30 LAB — CBC WITH DIFFERENTIAL/PLATELET
Abs Immature Granulocytes: 0.05 K/uL (ref 0.00–0.07)
Basophils Absolute: 0.1 K/uL (ref 0.0–0.1)
Basophils Relative: 0 %
Eosinophils Absolute: 0.1 K/uL (ref 0.0–0.5)
Eosinophils Relative: 1 %
HCT: 40.7 % (ref 36.0–46.0)
Hemoglobin: 13.9 g/dL (ref 12.0–15.0)
Immature Granulocytes: 0 %
Lymphocytes Relative: 31 %
Lymphs Abs: 3.9 K/uL (ref 0.7–4.0)
MCH: 30.4 pg (ref 26.0–34.0)
MCHC: 34.2 g/dL (ref 30.0–36.0)
MCV: 89.1 fL (ref 80.0–100.0)
Monocytes Absolute: 1 K/uL (ref 0.1–1.0)
Monocytes Relative: 8 %
Neutro Abs: 7.5 K/uL (ref 1.7–7.7)
Neutrophils Relative %: 60 %
Platelets: 340 K/uL (ref 150–400)
RBC: 4.57 MIL/uL (ref 3.87–5.11)
RDW: 12.6 % (ref 11.5–15.5)
WBC: 12.5 K/uL — ABNORMAL HIGH (ref 4.0–10.5)
nRBC: 0 % (ref 0.0–0.2)

## 2024-07-30 NOTE — Discharge Instructions (Addendum)
 Samantha [name]:  Thank you for allowing us  to take care of you today.  We hope you begin feeling better soon. You were seen today for seizure-like activity.  While you are performed physical examination as well as basic lab work that was reassuring.  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  After shared decision making, it was agreed upon that you would seek this out as outpatient follow-up.  Please bring this up to your PCP or neurologist (I have referred you to neurology).  Please also make an appointment with neurology in order to establish care.  To-Do:  Please follow-up with your primary doctor within the next 2-3 days. It is important that you review any labs or imaging results (if any) that you had today with them. Your preliminary imaging results (if any) are attached. Please return to the Emergency Department or call 911 if you experience chest pain, shortness of breath, severe pain, severe fever, altered mental status, or have any reason to think that you need emergency medical care.  Thank you again.  Hope you feel better soon.  Department of Emergency Medicine

## 2024-07-30 NOTE — ED Provider Notes (Signed)
  [place] EMERGENCY DEPARTMENT AT [facility] Provider Note  MDM   HPI/ROS:  Samantha [name] is a 18 y.o. adult with a medical history as below who presents ***   Physical exam is notable for: -***  On my initial evaluation, patient is:  -Vital signs stable.*** Patient afebrile***, hemodynamically stable***, and non-toxic appearing.*** -Additional history obtained from ***  This patient's current presentation, including their history and physical exam, is most consistent with ***. Differentials include ***.     Interpretations, interventions, and the patient's course of care are documented below.      ***   Disposition:  {ED Dispo:29898}  Clinical Impression: No diagnosis found.  Rx / DC Orders ED Discharge Orders     None       The plan for this patient was discussed with Dr. ***, who voiced agreement and who oversaw evaluation and treatment of this patient.   Clinical Complexity A medically appropriate history, review of systems, and physical exam was performed.  My independent interpretations of EKG, labs, and radiology are documented in the ED course above.   If decision rules were used in this patient's evaluation, they are listed below.  *** Click here for ABCD2, HEART and other calculatorsREFRESH Note before signing   Patient's presentation is most consistent with {EM COPA:27473}  Medical Decision Making Amount and/or Complexity of Data Reviewed Labs: ordered.    HPI/ROS      See MDM section for pertinent HPI and ROS. A complete ROS was performed with pertinent positives/negatives noted above.   Past Medical History:  Diagnosis Date   ADD (attention deficit disorder)    ADHD    Exercise-induced asthma    MDD (major depressive disorder)    Vascular Ehlers-Danlos syndrome     Past Surgical History:  Procedure Laterality Date   MENISCUS REPAIR        Physical Exam   Vitals:   07/30/24 1937 07/30/24 1938  BP: 120/67    Pulse: 69   Resp: 16   Temp: 97.6 F (36.4 C)   TempSrc: Oral   SpO2: 99%   Weight:  79.4 kg  Height:  5' 2 (1.575 m)    Physical Exam   Procedures   If procedures were preformed on this patient, they are listed below:  Procedures   @BBSIG @   Please note that this documentation was produced with the assistance of voice-to-text technology and may contain errors.

## 2024-07-30 NOTE — Progress Notes (Signed)
 Virtual Visit via Video Note  I connected with Samantha [name] on 07/30/24 at 10:00 AM EST by a video enabled telemedicine application and verified that I am speaking with the correct person using two identifiers.  Location: Patient: [place] Provider: Office   I discussed the limitations of evaluation and management by telemedicine and the availability of in person appointments. The patient expressed understanding and agreed to proceed.      I discussed the assessment and treatment plan with the patient. The patient was provided an opportunity to ask questions and all were answered. The patient agreed with the plan and demonstrated an understanding of the instructions.   The patient was advised to call back or seek an in-person evaluation if the symptoms worsen or if the condition fails to improve as anticipated.    Shelton CHRISTELLA [name], MD     [facility] MD/PA/NP OP Progress Note  07/30/24 3:00 PM [name]  MRN:  [id number]  Chief Complaint: Medication management follow-up.  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  They are currently prescribed lamotrigine  50 mg twice daily, trazodone  50 mg at bedtime, BuSpar  10 mg twice a day and Lexapro  20 mg once a day. Past med trials - clonidine  for tics caused sedation and no improvement, Abilify  (no improvement with tics, but helped with mood, caused weight gain).   HPI:   Samantha [name] was evaluated over telemedicine encounter for medication management follow-up.  In the interim since last appointment, they contacted office reporting increased derealization, anxiety and therefore was seen on an emergency basis by my colleague Dr. Okey who was covering for me while I was away.  Records were reviewed from the appointment with Dr. Okey, she was recommended to increase the dose of  Lamictal  to 100 mg twice daily and start guanfacine for tics and ADHD.  During the evaluation today, patient tells me that she has been going through a lot more stress and anxiety recently.  She tells me that she had surgery on her right knee during the month of October, which put her behind with her schoolwork that led to increase in stress and she has been feeling much more anxious through the month of October.  She tells me that her anxiety is settling down however she has been noticing more obsessive thoughts which she describes as not being able to calm down until she does the compulsive rituals related to body symmetry or eat certain edible food.  She tells me that with guanfacine she has noticed more syncopal episode and twitching of her face during these episodes, this has occurred in the past during her high school.  We discussed to discontinue guanfacine for now.  She denies SI or HI.  I discussed with her that her current presentation appears to be in the context of her increased anxiety, therefore recommending to increase the dose of BuSpar  to 20 mg twice daily from 10 mg 3 times daily.  She verbalized understanding.  She is taking Lamictal  75 mg twice daily and not 100 mg twice daily, recommended to continue for now.  I discussed with her that my next availability is in about 3 months, and she requires more frequent follow-up.  Discussed alternatives for follow-up such as establishing outpatient psychiatric care elsewhere, she asked me if I can check with Dr. Okey and if she has availability.  Discussed that  I will check with her availability and if she does not then she will how to search for psychiatrist by herself.  She verbalized understanding.  Visit Diagnosis:    ICD-10-CM   1. Other specified anxiety disorders  F41.8     2. Insomnia due to other mental disorder  F51.05    F99     3. Gender dysphoria  F64.9     4. Tourette disorder  F95.2     5. Mixed obsessional thoughts and acts   F42.2                      Past Psychiatric History: One past psychiatric hospitalization, past med trial include Prozac, and clonidine (made them zombie), Abilify  - weight gain and not effective for tics, Currently seeing Ms. Derry Bon at [facility] for ind therapy  Psychological evaluation from 06/10/21  Clinician: Chiquita Dawn, PhD, Licensed Psychologist, Pediatric Neuropsychologist  Reason for Referral   Samantha [name]) Samantha [name] is a 18 year old right-handed teenager with a history of concern about the development of their attentional system. Additionally, the have diagnoses of anxiety, depression, obsessive compulsive disorder, Tourette syndrome, and Ehlers Danlos syndrome. Samantha also has some curiosity if they may also have an autism spectrum disorder. They were referred for neuropsychological testing due to a long history of difficulty with task completion at home and at school, causing functioning impairment in both arenas.   Key Findings   Note: Rather than discussing every score, the following is a synopsis of the most important findings from testing. Please refer to the appendix at the end of this report for a complete list of tests administered and their associated scores.  Samantha's profile and history are consistent with attention deficit hyperactivity disorder (ADHD). Many of the stressors at school and at home were reportedly associated with disorganization, poor task completion, and emotional overwhelm. These are all suggestive of difficulty with real world planning, organization, working memory and emotional regulation associated with ADHD. Despite intact performance on several executive functioning measures during assessment, parent ratings real world skill utilization of inhibition, emotional control, working memory, optician, dispensing, task-monitor, and organization of materials were significantly elevated. As for history of symptoms, Samantha  has always been labeled "chatty" at school and Ms. Rudder reported that as a child, they were always on the go. Symptoms of hyperactivity seem to be long standing.   Samantha's presentation and history is also notable a complex constellation of psychiatric symptoms. They report feeling like they are in an improved place regarding mood and are currently in counseling and taking several medications. Previously reported "functional collapsing" episodes continue to occur frequently at school and home. Strategies are in place currently and Samantha is on a wait list for treatment out of state to address these. Today, the focus of the evaluation was difficulties impacting learning. ADHD appears to be the most significant factor currently not being addressed. That said, Samantha reports ongoing symptoms of OCD, Anxiety and Tourette syndrome, which often co-occur. Functional impact of these symptoms appears to be manageable with current treatments.   Despite these challenges, Samantha also exhibited several strengths within their neuropsychological profile.   Intelligence: Samantha's intelligence quotient is overall average, with high average verbal intelligence.    Executive skill demonstration: [name] demonstrated average to above set shifting, inhibition, and deductive reasoning on assessment tasks. While they don't demonstrat these skills in day to day living, the presence of this skill set suggests that when distractions are limited and the  task is outlined and clear, Samantha can be expected to have more success accessing their neurocognitive strengths.   Conclusions   Taken together, Samantha [name] demonstrated age appropriate or better skills on direct assessment today with elevated ratings of their attention and executive functioning across home and school settings. There is no indication on testing of cognitive or learning disorders that could be impacting their success at school. 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 As discussed during the evaluation, the questions of an autism spectrum disorder is best evaluated by another clinic and those recommendations are below.   Given the complexity of Samantha's current psychological presentation and ongoing treatment, I am hopeful this additional information with be another important place for intervention and improvement in functioning. It is possible that significant stress and dysfunction from symptoms of ADHD is impacting their overall psychological functioning. It is my hope that with appropriate intervention, Samantha [name] will see improved functioning globally.   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   Family History:  Family History  Problem Relation Age of Onset   Anxiety disorder Mother    Polycystic ovary syndrome Mother    Hyperlipidemia Father     Social History:  Social History   Socioeconomic History   Marital status: Single    Spouse name: Not on file   Number of children: Not on file   Years of education: Not on file   Highest education level: Not on file  Occupational History   Not on file  Tobacco Use   Smoking status: Never    Passive exposure: Never   Smokeless tobacco: Never  Vaping Use   Vaping status: Never Used  Substance and Sexual Activity   Alcohol use: Yes    Comment: occasionally   Drug use: Yes    Types: Marijuana    Comment: Socially    Sexual activity: Not Currently    Birth control/protection: I.U.D.  Other Topics Concern   Not on file  Social History Narrative   Not on file   Social Drivers of Health   Financial Resource Strain: Not on file  Food Insecurity: Not on file  Transportation Needs: Not on file  Physical Activity: Inactive (05/18/2024)   Exercise Vital Sign    Days of Exercise per Week: 0 days    Minutes of Exercise per Session: 0 min  Stress: Stress Concern Present (05/18/2024)   [employer] of Occupational Health - Occupational Stress Questionnaire    Feeling of Stress: To some extent  Social Connections: Not on file    Allergies: No Known Allergies  Metabolic Disorder Labs: Lab Results  Component Value Date   HGBA1C 5.3 05/18/2024   MPG 105 05/18/2024   No results found for: PROLACTIN Lab Results  Component Value Date   CHOL 260 (H) 05/18/2024   TRIG 257 (H) 05/18/2024   HDL 52 05/18/2024   CHOLHDL 5.0 (H) 05/18/2024   LDLCALC 166 (H) 05/18/2024  LDLCALC 128 (H) 12/17/2019   Lab Results  Component Value Date   TSH 2.01 05/18/2024   TSH 1.330 12/17/2019    Therapeutic Level Labs: No results found for: LITHIUM No results found for: VALPROATE No results found for: CBMZ  Current Medications: Current Outpatient Medications  Medication Sig Dispense Refill   busPIRone  (BUSPAR ) 10 MG tablet Take 1 tablet (10 mg total) by mouth 3 (three) times daily. 180 tablet 1   diclofenac (VOLTAREN) 75 MG EC tablet Take 75 mg by mouth 2 (two) times daily. (Patient not taking: Reported on 06/11/2024)     escitalopram  (LEXAPRO ) 20 MG tablet Take 1 tablet (20 mg total) by mouth daily. 90 tablet 1   hydrOXYzine  (ATARAX ) 25 MG tablet Take 0.5-1 tablets (12.5-25 mg total) by mouth 3 (three) times daily as needed for anxiety. 30 tablet 0   lamoTRIgine  (LAMICTAL ) 100 MG tablet Take 1 tablet (100 mg total) by mouth 2 (two) times daily. 60 tablet 2   levonorgestrel (KYLEENA) 19.5 MG IUD 1 each  by Intrauterine route once.     Riboflavin-Magnesium-Feverfew (MIGRELIEF PO) Take 1 tablet by mouth daily.     traZODone  (DESYREL ) 50 MG tablet TAKE 1 TABLET BY MOUTH EVERYDAY AT BEDTIME 90 tablet 1   No current facility-administered medications for this visit.     Musculoskeletal: Strength & Muscle Tone: unable to assess since visit was over the telemedicine. Gait & Station: unable to assess since visit was over the telemedicine. Patient leans: N/A  Psychiatric Specialty Exam: Review of 12 systems negative except as mentioned in HPI   Mental Status Exam: Appearance: casually dressed; well groomed; no overt signs of trauma or distress noted Attitude: calm, cooperative with good eye contact Activity: No PMA/PMR, no tics/no tremors; no EPS noted  Speech: normal rate, rhythm and volume Thought Process: Logical, linear, and goal-directed.  Associations: no looseness, tangentiality, circumstantiality, flight of ideas, thought blocking or word salad noted Thought Content: (abnormal/psychotic thoughts): no abnormal or delusional thought process evidenced SI/HI: denies Si/Hi Perception: no illusions or visual/auditory hallucinations noted; no response to internal stimuli demonstrated Mood & Affect: good/full range, neutral Judgment & Insight: both fair Attention and Concentration : Good Cognition : WNL Language : Good ADL - Intact    Screenings: GAD-7    Flowsheet Row Office Visit from 05/18/2024 in [facility] Video Visit from 05/05/2021 in [facility]  Total GAD-7 Score 0 12   PHQ2-9    Flowsheet Row Office Visit from 05/18/2024 in [facility] Video Visit from 05/05/2021 in [facility]  PHQ-2 Total Score 0 3  PHQ-9 Total Score 0 16   Flowsheet Row ED from 06/09/2023 in [facility] Emergency Department at [facility] ED from 05[facility]11/2023 in  [facility] Emergency Department at [facility] from 09/06/2021 in [facility] at [facility]   C-SSRS RISK CATEGORY High Risk High Risk No Risk     Assessment and Plan:   18 year old AFAB, identifies as non binary, prefers pronoun they/them with anxiety, mood disorder (most likely bipolar 2 disorder vs MDD), Gender Dysphoria, OCD, Tourette's Syndrome, Sleeping difficulties, one previous psychiatric hospitalization in the context of SI with plan in 2019 currently on Lexapro  20 mg daily, Trazodone  50 mg QHS, Lamictal  50 mg BID, Buspar  10 mg BIDand in weekly to once every other week ind therapy. Neuropsychological evaluation revealed a new diagnosis of ADHD.  Patient is  doing well academically and parents are not interested in medication treatment for ADHD.  They have hx of  fainting spells/seizure like activity which now seem to occur very less.  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    Update on 07/30/24  -  Reviewed response to the current medications, they appear to have worsening of anxiety and related somatic symptoms in the context of recent psychosocial stressors. They are already on max dose of Lexapro  recommended to increase Buspar  to improve anxiety, continue with ind therapy, continue with Lamictal  75 mg twice daily. They will transition their psychiatric care to Dr. Okey whom they saw about two weeks ago and will cancel their appointment in February.      Plan was reviewed on 07/30/24     #1 Anxiety/OCD (chronic, stable) - Continue Lexapro  20 mg daily - Continue with ind therapy at [facility]. Sees them every 2 weeks - Increase Buspar  to 20 mg two times daily. - Continue with Atarax (hydroxyzine ) 12.5-25 mg three times daily as needed for anxiety       #2 Mood/Gender Dysphoria (chronic,stable)  - Continue with Lamictal   75 mg BID.  -  Continue with Lexapro  20 mg daily as mentioned above.  - Therapy as mentioned above. Also recommended family therapy once every 3-4 weeks with current therapist.   #3 Tics (chronic, stable) - Tics are better - Continue with therapy  #4 Sleeping difficulties (chronic, stable) - Continue Trazodone  50 mg QHS for sleep.         This note was generated in part or whole with voice recognition software. Voice recognition is usually quite accurate but there are transcription errors that can and very often do occur. I apologize for any typographical errors that were not detected and corrected.   MDM = 2 or more chronic stable conditions + med management         Shelton CHRISTELLA [name], MD 07/30/2024, 10:55 AM

## 2024-07-30 NOTE — ED Triage Notes (Signed)
 Pt was BIB EMS due to having 3 witnessed seizures, lasting about 1 minute each. Pt has hx of seizures, but takes no medications for seizures. Pt also reports having Tourettes, takes Lamictal  that was recently increased from 100mg  to 150mg . Pt states that her PCP also added Guanphazine two weeks ago and started seeing and increase in the amount of Tic's and seizure-like activity. Pt reports that she stopped taking the Guanphazine today. A&Ox4.

## 2024-08-01 DIAGNOSIS — R55 Syncope and collapse: Secondary | ICD-10-CM | POA: Diagnosis not present

## 2024-08-01 DIAGNOSIS — R002 Palpitations: Secondary | ICD-10-CM | POA: Diagnosis not present

## 2024-08-08 ENCOUNTER — Encounter: Payer: Self-pay | Admitting: Nurse Practitioner

## 2024-08-08 ENCOUNTER — Ambulatory Visit: Admitting: Nurse Practitioner

## 2024-08-08 VITALS — BP 130/82 | HR 66 | Temp 98.0°F | Ht 62.0 in | Wt 169.0 lb

## 2024-08-08 DIAGNOSIS — F952 Tourette's disorder: Secondary | ICD-10-CM | POA: Diagnosis not present

## 2024-08-08 DIAGNOSIS — R569 Unspecified convulsions: Secondary | ICD-10-CM

## 2024-08-08 DIAGNOSIS — M2550 Pain in unspecified joint: Secondary | ICD-10-CM | POA: Diagnosis not present

## 2024-08-08 DIAGNOSIS — Q7963 Vascular Ehlers-Danlos syndrome: Secondary | ICD-10-CM

## 2024-08-08 NOTE — Assessment & Plan Note (Signed)
 New neurology referral placed

## 2024-08-08 NOTE — Progress Notes (Signed)
 BP 130/82   Pulse 66   Temp 98 F (36.7 C)   Ht 5' 2 (1.575 m)   Wt 169 lb (76.7 kg)   SpO2 98%   BMI 30.91 kg/m    Subjective:    Patient ID: Samantha [name], adult    DOB: [birth year], 18 y.o.   MRN: [id number]  HPI: Samantha [name] is a 18 y.o. adult with a history of tourette disorder, anxiety, depression, insomnia, ADHD, and vascular-ehlers danlos syndrome who presents today for a hospital follow-up. She was seen in the ER on 07/30/24 for seizure like activity. Patient reportedly had a minute of tonic-clonic convulsions several times. She has had these before and were triggered by emotions. She is not currently seeing a neurologist and is not on medication for her seizures. She denies having an episode like this since leaving the hospital. Requesting new referral for neurology.    Joint Pain: Increased knee pain, thumb pain, and shoulder pain since September 2025.Has a history of Ehlers Danlos and endorses joint pain for years now. She is wearing a thumb brace to help with joint pain in thumb. Ordering autoimmune panel today along with CRP and sed rate.     ER note from 07/30/24  HPI/ROS:  Samantha [name] is a 18 y.o. adult with a PMHx of Tourrettes, anxiety, ADHD, MDD medical history as below who presents with seizure-like activity. Patient BIB EMS for 3 witnessed episodes lasting about 1 minute each with tonic-clonic convulsions in the upper extremities. No loss of bowel or bladder function, no tongue biting. Patient endorses that she was told bad news before the events and began feeling overwhelmed and flushed. Patient's roommate and father at bedside provide additional history. Father reports that patient has been having these episodes for approximately 3 years and has had discussions with psychiatry about non-epileptic seizures. They state that these episodes have all been followed by an  emotional trigger and patient is aware that they're about to happen and there is no  postictal state present.    On my initial evaluation, patient is:  -Vital signs stable. Patient afebrile, hemodynamically stable, and non-toxic appearing.   Physical exam is notable for: -Neurologic exam is nonfocal, 5/5 strength bilateral UE and LE. No dysmetria, no gait abnormalities, no nystagmus -- At baseline   This patient's current presentation, including their history and physical exam, is most consistent with non-epileptic seizures. Differentials include epileptic seizures, intracranial mass/bleed/abnormality, electrolyte derangements, infection.    CBC with slight leukocytosis WBC of , CMP without medically relevant abnormalities. Discussed with patient that I would like to obtain CT imaging but patient declined. Patient and father state that they believe these are non-epileptic and would prefer to have neurology referral with outpatient CT imaging. Given reassuring neurologic examination as well as patient's recollection of events with hisotry, feel that this is appropriate and that CT imaging is not needed emergently.   Plan for outpatient referral to neurology for seizure evaluation and CT imaging.    Interpretations, interventions, and the patient's course of care are documented below.          Disposition:   I discussed the plan for discharge with the patient and/or their surrogate at bedside prior to discharge and they were in agreement with the plan and verbalized understanding of the return precautions provided. All questions answered to the best of my ability. Ultimately, the patient was discharged in stable condition with stable vital signs. I am reassured that they are capable of close  follow up and good social support at home.    Clinical Impression:  1. Seizure-like activity (HCC)     Physical Exam         Vitals:    07/30/24 1937 07/30/24 1938  BP: 120/67    Pulse: 69    Resp: 16    Temp: 97.6 F (36.4 C)    TempSrc: Oral    SpO2: 99%    Weight:   79.4 kg   Height:   5' 2 (1.575 m)    Physical Exam Vitals and nursing note reviewed.  Constitutional:      General: Samantha [name] is not in acute distress.    Appearance: Samantha [name] is well-developed.  HENT:     Head: Normocephalic and atraumatic.  Eyes:     Conjunctiva/sclera: Conjunctivae normal.  Cardiovascular:     Rate and Rhythm: Normal rate and regular rhythm.     Heart sounds: No murmur heard. Pulmonary:     Effort: Pulmonary effort is normal. No respiratory distress.     Breath sounds: Normal breath sounds.  Abdominal:     Palpations: Abdomen is soft.     Tenderness: There is no abdominal tenderness.  Musculoskeletal:        General: No swelling.     Cervical back: Neck supple.  Skin:    General: Skin is warm and dry.     Capillary Refill: Capillary refill takes less than 2 seconds.  Neurological:     Mental Status: Samantha [name] is alert.  Psychiatric:        Mood and Affect: Mood normal.         Procedures     If procedures were preformed on this patient, they are listed below:  Procedures     Please note that this documentation was produced with the assistance of voice-to-text technology and may contain errors.     Billy Pal, MD 08/01/24 1052     Tegeler, Lonni PARAS, MD 08/03/24 1254           08/08/2024    2:16 PM 05/18/2024    2:20 PM 05/05/2021   10:19 AM  Depression screen PHQ 2/9  Decreased Interest 1 0   Down, Depressed, Hopeless 1 0   PHQ - 2 Score 2 0   Altered sleeping 2 0   Tired, decreased energy 3 0   Change in appetite 2 0   Feeling bad or failure about yourself  0 0   Trouble concentrating 1 0   Moving slowly or fidgety/restless 0 0   Suicidal thoughts 0 0   PHQ-9 Score 10 0    Difficult doing work/chores Somewhat difficult Not difficult at all      Information is confidential and restricted. Go to Review Flowsheets to unlock data.   Data saved with a previous flowsheet row definition    Relevant past medical, surgical, family  and social history reviewed and updated as indicated. Interim medical history since our last visit reviewed. Allergies and medications reviewed and updated.  Review of Systems  Ten systems reviewed and is negative except as mentioned in HPI      Objective:     BP 130/82   Pulse 66   Temp 98 F (36.7 C)   Ht 5' 2 (1.575 m)   Wt 169 lb (76.7 kg)   SpO2 98%   BMI 30.91 kg/m    Wt Readings from Last 3 Encounters:  08/08/24 169 lb (76.7 kg) (92%, Z= 1.41)*  07/30/24 175  lb (79.4 kg) (94%, Z= 1.54)*  06/11/24 171 lb 3.2 oz (77.7 kg) (93%, Z= 1.47)*   * Growth percentiles are based on CDC (Girls, 2-20 Years) data.    Physical Exam Constitutional:      Appearance: Normal appearance.  HENT:     Head: Normocephalic and atraumatic.  Cardiovascular:     Rate and Rhythm: Normal rate and regular rhythm.     Pulses: Normal pulses.     Heart sounds: Normal heart sounds.  Pulmonary:     Effort: Pulmonary effort is normal.     Breath sounds: Normal breath sounds.  Musculoskeletal:        General: Normal range of motion.     Cervical back: Normal range of motion and neck supple.  Skin:    General: Skin is warm and dry.  Neurological:     General: No focal deficit present.     Mental Status: Samantha [name] is alert and oriented to person, place, and time.  Psychiatric:        Mood and Affect: Mood normal.        Behavior: Behavior normal.        Thought Content: Thought content normal.        Judgment: Judgment normal.      Results for orders placed or performed during the hospital encounter of 07/30/24  Comprehensive metabolic panel   Collection Time: 07/30/24  7:43 PM  Result Value Ref Range   Sodium 142 135 - 145 mmol/L   Potassium 3.5 3.5 - 5.1 mmol/L   Chloride 107 98 - 111 mmol/L   CO2 20 (L) 22 - 32 mmol/L   Glucose, Bld 84 70 - 99 mg/dL   BUN 9 6 - 20 mg/dL   Creatinine, Ser 9.28 0.44 - 1.00 mg/dL   Calcium 8.6 (L) 8.9 - 10.3 mg/dL   Total Protein 6.2 (L) 6.5 - 8.1  g/dL   Albumin 3.5 3.5 - 5.0 g/dL   AST 24 15 - 41 U/L   ALT 22 0 - 44 U/L   Alkaline Phosphatase 71 38 - 126 U/L   Total Bilirubin 0.2 0.0 - 1.2 mg/dL   GFR, Estimated >39 >39 mL/min   Anion gap 15 5 - 15  CBC with Differential/Platelet   Collection Time: 07/30/24  7:43 PM  Result Value Ref Range   WBC 12.5 (H) 4.0 - 10.5 K/uL   RBC 4.57 3.87 - 5.11 MIL/uL   Hemoglobin 13.9 12.0 - 15.0 g/dL   HCT 59.2 63.9 - 53.9 %   MCV 89.1 80.0 - 100.0 fL   MCH 30.4 26.0 - 34.0 pg   MCHC 34.2 30.0 - 36.0 g/dL   RDW 87.3 88.4 - 84.4 %   Platelets 340 150 - 400 K/uL   nRBC 0.0 0.0 - 0.2 %   Neutrophils Relative % 60 %   Neutro Abs 7.5 1.7 - 7.7 K/uL   Lymphocytes Relative 31 %   Lymphs Abs 3.9 0.7 - 4.0 K/uL   Monocytes Relative 8 %   Monocytes Absolute 1.0 0.1 - 1.0 K/uL   Eosinophils Relative 1 %   Eosinophils Absolute 0.1 0.0 - 0.5 K/uL   Basophils Relative 0 %   Basophils Absolute 0.1 0.0 - 0.1 K/uL   Immature Granulocytes 0 %   Abs Immature Granulocytes 0.05 0.00 - 0.07 K/uL          Assessment & Plan:   Problem List Items Addressed This Visit  Nervous and Auditory   Tourette disorder - Primary   New neurology referral placed.         Musculoskeletal and Integument   Vascular Ehlers-Danlos syndrome   Complaining of increased joint pain. Continue wearing thumb brace for support. Also checking auto-immune panel, CRP and sed rate.       Other Visit Diagnoses       Seizure-like activity [facility])       Neurology referral placed. Recommended calling them as soon as possible for appt.   Relevant Orders   Ambulatory referral to Neurology     Arthralgia, unspecified joint       autoimmune panel ordered along with CRP and Sed rate. Continue wearing thumb brace for support.   Relevant Orders   Analyzer ANA IFA w/RFLX Titer/Pattern,Systemic Autoimmune Panel 1   C-reactive protein   Sedimentation rate     New Neurology referral placed. Phone number provided to  patient. Discussed calling Neurology clinic to set up appointment as soon as possible for seizure like activity, which patient was evaluated in the emergency room for. ER Exam was unremarkable and no imaging was done, per patient's request. Neuro exam unremarkable today.   Ordering auto-immune panel, CRP, and sed rate d/t patient complaining of joint pain in knee, thumb, and shoulders. Advised to continue wearing thumb brace to help provide support. We will notify patient of lab results.          Follow up plan: Return if symptoms worsen or fail to improve.  I have reviewed this encounter including the documentation in this note and/or discussed this patient with the provider, Aislinn Womack, SNP, I am certifying that I agree with the content of this note as supervising/preceptor nurse practitioner.  Mliss Spray, FNP-C [facility] 08/08/2024, 3:37 PM

## 2024-08-08 NOTE — Assessment & Plan Note (Signed)
 Complaining of increased joint pain. Continue wearing thumb brace for support. Also checking auto-immune panel, CRP and sed rate.

## 2024-08-09 ENCOUNTER — Ambulatory Visit: Payer: Self-pay | Admitting: Nurse Practitioner

## 2024-08-09 DIAGNOSIS — M2550 Pain in unspecified joint: Secondary | ICD-10-CM

## 2024-08-09 DIAGNOSIS — R7689 Other specified abnormal immunological findings in serum: Secondary | ICD-10-CM

## 2024-08-16 LAB — ANALYZER(R)ANA IFA WITH REFLEX TITER/PATTRN,SYS AUTOIMM PNL1
Anti Nuclear Antibody (ANA): POSITIVE — AB
Anticardiolipin IgA: <2 [APL'U]/mL
Anticardiolipin IgG: <2 [GPL'U]/mL
Anticardiolipin IgM: 6.7 [MPL'U]/mL
Beta-2 Glyco 1 IgA: <2 U/mL
Beta-2 Glyco 1 IgM: 3.5 U/mL
Beta-2 Glyco I IgG: <2 U/mL
C3 Complement: 154 mg/dL (ref 83–193)
C4 Complement: 25 mg/dL (ref 15–57)
Centromere Ab Screen: <1 AI
Chromatin (Nucleosomal) Antibody: <1 AI
Cyclic Citrullin Peptide Ab: <16 U
DNA Ab (DS) Crithidia, IFA: NEGATIVE
ENA SM Ab Ser-aCnc: <1 AI
Jo-1 Autoabs: <1 AI
MUTATED CITRULLINATED VIMENTIN (MCV) AB: <20 U/mL (ref <20)
Rheumatoid Factor (IgA): <5 U
Rheumatoid Factor (IgG): 6 U
Rheumatoid Factor (IgM): 13 U — ABNORMAL HIGH
Ribonucleic Protein(ENA) Antibody, IgG: <1 AI
SM/RNP: <1 AI
SSA (Ro) (ENA) Antibody, IgG: <1 AI
SSB (La) (ENA) Antibody, IgG: <1 AI
Scleroderma (Scl-70) (ENA) Antibody, IgG: <1 AI
Thyroperoxidase Ab SerPl-aCnc: 1 [IU]/mL (ref <9)

## 2024-08-16 LAB — ANTI-NUCLEAR AB-TITER (ANA TITER): ANA Titer 1: 1:320 {titer} — ABNORMAL HIGH

## 2024-08-16 LAB — C-REACTIVE PROTEIN: CRP: <3 mg/L (ref <8.0)

## 2024-08-16 LAB — SEDIMENTATION RATE: Sed Rate: 2 mm/h (ref 0–20)

## 2024-08-23 ENCOUNTER — Other Ambulatory Visit: Payer: Self-pay | Admitting: Neurology

## 2024-08-23 DIAGNOSIS — R569 Unspecified convulsions: Secondary | ICD-10-CM

## 2024-08-23 DIAGNOSIS — R55 Syncope and collapse: Secondary | ICD-10-CM

## 2024-08-23 DIAGNOSIS — Q7963 Vascular Ehlers-Danlos syndrome: Secondary | ICD-10-CM

## 2024-09-15 ENCOUNTER — Ambulatory Visit
Admission: RE | Admit: 2024-09-15 | Discharge: 2024-09-15 | Disposition: A | Source: Ambulatory Visit | Attending: Neurology | Admitting: Neurology

## 2024-09-15 DIAGNOSIS — R569 Unspecified convulsions: Secondary | ICD-10-CM

## 2024-09-15 DIAGNOSIS — R55 Syncope and collapse: Secondary | ICD-10-CM

## 2024-09-15 DIAGNOSIS — Q7963 Vascular Ehlers-Danlos syndrome: Secondary | ICD-10-CM

## 2024-09-24 ENCOUNTER — Encounter (HOSPITAL_COMMUNITY): Payer: Self-pay

## 2024-09-24 NOTE — Telephone Encounter (Signed)
 There is no specific testing for this , its based on symptoms and history. Please make an appointment to discuss

## 2024-09-28 ENCOUNTER — Ambulatory Visit: Admitting: Neurology

## 2024-10-07 ENCOUNTER — Other Ambulatory Visit (HOSPITAL_COMMUNITY): Payer: Self-pay | Admitting: Psychiatry

## 2024-10-08 ENCOUNTER — Other Ambulatory Visit (HOSPITAL_COMMUNITY): Payer: Self-pay | Admitting: Psychiatry

## 2024-10-08 MED ORDER — LAMOTRIGINE 100 MG PO TABS: 100.0000 mg | ORAL_TABLET | Freq: Two times a day (BID) | ORAL | 2 refills | Status: AC

## 2024-10-08 NOTE — Telephone Encounter (Signed)
 Please call pt to see if she is going to continue with me or Dr. Marcille. She has appt scheduled with him in Feb

## 2024-10-09 NOTE — Progress Notes (Unsigned)
 "  Office Visit Note  Patient: Samantha [name]             Date of Birth: 2006-08-01           MRN: [id number]             PCP: Gareth Mliss FALCON, FNP Referring: Gareth Mliss FALCON, FNP Visit Date: 10/23/2024 Occupation: Data Unavailable  Subjective:  Positive ANA   History of Present Illness: Samantha [name] is a 19 y.o. adult who presents today for a new patient consultation.       Activities of Daily Living:  Patient reports morning stiffness for *** {minute/hour:19697}.   Patient {ACTIONS;DENIES/REPORTS:21021675::Denies} nocturnal pain.  Difficulty dressing/grooming: {ACTIONS;DENIES/REPORTS:21021675::Denies} Difficulty climbing stairs: {ACTIONS;DENIES/REPORTS:21021675::Denies} Difficulty getting out of chair: {ACTIONS;DENIES/REPORTS:21021675::Denies} Difficulty using hands for taps, buttons, cutlery, and/or writing: {ACTIONS;DENIES/REPORTS:21021675::Denies}  No Rheumatology ROS completed.   PMFS History:  Patient Active Problem List   Diagnosis Date Noted   Vascular Ehlers-Danlos syndrome 05/18/2024   Complex tear of medial meniscus of left knee 05/18/2024   POTS (postural orthostatic tachycardia syndrome) 05/18/2024   PCOS (polycystic ovarian syndrome) 05/18/2024   Attention deficit hyperactivity disorder (ADHD), combined type 05/18/2024   Family history of aneurysm 05/18/2024   Adjustment disorder with mixed anxiety and depressed mood 05/11/2023   Deliberate self-cutting 05/11/2023   Mood disorder 11/06/2019   Tourette disorder 11/06/2019   Other specified anxiety disorders 09/11/2019   Gender dysphoria 09/11/2019   Mixed obsessional thoughts and acts 09/11/2019   Insomnia due to other mental disorder 09/11/2019    Past Medical History:  Diagnosis Date   ADD (attention deficit disorder)    ADHD    Exercise-induced asthma    MDD (major depressive disorder)    Vascular Ehlers-Danlos syndrome     Family History  Problem Relation Age of Onset   Anxiety  disorder Mother    Polycystic ovary syndrome Mother    Hyperlipidemia Father    Past Surgical History:  Procedure Laterality Date   MENISCUS REPAIR     Social History[1] Social History   Social History Narrative   Not on file     Immunization History  Administered Date(s) Administered   DTaP 05/19/2007   DTaP / IPV 05/12/2010   Dtap, Unspecified 10/29/2005, 12/30/2005, 03/04/2006   HIB (PRP-T) 06/11/2008   HIB, Unspecified 10/29/2005, 12/30/2005   HPV 9-valent 05/11/2017, 04/11/2018   Hep B, Unspecified 09-06-2005, 09/27/2005, 05/19/2006   Hepatitis A, Ped/Adol-2 Dose 05/19/2007, 06/11/2008   Influenza Nasal 08/31/2007, 06/11/2008   Influenza,Quad,Nasal, Live 07/18/2012   Influenza,inj,Quad PF,6+ Mos 06/28/2018, 09/24/2020, 07/14/2021, 07/28/2022, 01/03/2023   Influenza-Unspecified 07/05/2019   MMR 01/05/2007, 05/12/2010   Meningococcal B, OMV 08/28/2021, 01/03/2023   Meningococcal Mcv4o 05/11/2017, 08/28/2021   PFIZER(Purple Top)SARS-COV-2 Vaccination 02/06/2020, 02/27/2020, 10/03/2020, 02/21/2021   Pneumococcal Conjugate PCV 7 10/29/2005, 12/30/2005, 03/04/2006, 01/05/2007   Pneumococcal Conjugate-13 05/12/2010   Polio, Unspecified 10/29/2005, 12/30/2005, 05/19/2006   Tdap 03/12/2016   Varicella 01/05/2007, 08/31/2007     Objective: Vital Signs: There were no vitals taken for this visit.   Physical Exam Vitals and nursing note reviewed.  Constitutional:      Appearance: Samantha [name] is well-developed.  HENT:     Head: Normocephalic and atraumatic.  Eyes:     Conjunctiva/sclera: Conjunctivae normal.     Pupils: Pupils are equal, round, and reactive to light.  Cardiovascular:     Rate and Rhythm: Normal rate and regular rhythm.     Heart sounds: Normal heart sounds.  Pulmonary:  Effort: Pulmonary effort is normal.     Breath sounds: Normal breath sounds.  Abdominal:     General: Bowel sounds are normal.     Palpations: Abdomen is soft.  Musculoskeletal:      Cervical back: Normal range of motion and neck supple.  Skin:    General: Skin is warm and dry.     Capillary Refill: Capillary refill takes less than 2 seconds.  Neurological:     Mental Status: Samantha [name] is alert and oriented to person, place, and time.  Psychiatric:        Behavior: Behavior normal.      Musculoskeletal Exam: ***  CDAI Exam: CDAI Score: -- Patient Global: --; Provider Global: -- Swollen: --; Tender: -- Joint Exam 10/23/2024   No joint exam has been documented for this visit   There is currently no information documented on the homunculus. Go to the Rheumatology activity and complete the homunculus joint exam.  Investigation: No additional findings.  Imaging: MR ANGIO HEAD WO CONTRAST Result Date: 09/28/2024 EXAM: MR Angiography Head without intravenous Contrast. 09/15/2024 08:41:21 AM TECHNIQUE: Magnetic resonance angiography images of the head without intravenous contrast. Multiplanar 2D and 3D reformatted images are provided for review. COMPARISON: None provided. CLINICAL HISTORY: Patient complains of family history of aneurysm; Patient having chronic migraines for 2 years. FINDINGS: ANTERIOR CIRCULATION: Internal carotid arteries are patent .anterior and middle cerebral arteries are patent. No stenosis or aneurysm. POSTERIOR CIRCULATION: Multiple arteries are patent. The basilar artery is patent. Posterior cerebral arteries are patent. Small posterior communicating arteries are present. No significant stenosis or aneurysm. IMPRESSION: 1. Unremarkable MRA of the head Electronically signed by: Santina Blanch MD MD 09/28/2024 11:07 AM EST RP Workstation: [id number]   MR BRAIN WO CONTRAST Result Date: 09/28/2024 EXAM: MRI BRAIN WITHOUT CONTRAST 09/15/2024 08:32:54 AM TECHNIQUE: Multiplanar multisequence MRI of the head/brain was performed without the administration of intravenous contrast. COMPARISON: None available. CLINICAL HISTORY: Patient complains of family history  of aneurysm; Patient having chronic migraines for 2 years. FINDINGS: BRAIN AND VENTRICLES: Masticules and sulci are normal in size and configuration. . . . No acute infarct. No intracranial hemorrhage. No mass. No midline shift. No hydrocephalus. The sella is unremarkable. Normal flow voids. ORBITS: No acute abnormality. SINUSES AND MASTOIDS: No acute abnormality. BONES AND SOFT TISSUES: Normal marrow signal. No acute soft tissue abnormality. IMPRESSION: 1. No acute or significant intracranial abnormality. Electronically signed by: Santina Blanch MD MD 09/28/2024 11:05 AM EST RP Workstation: [id number]    Recent Labs: Lab Results  Component Value Date   WBC 12.5 (H) 07/30/2024   HGB 13.9 07/30/2024   PLT 340 07/30/2024   NA 142 07/30/2024   K 3.5 07/30/2024   CL 107 07/30/2024   CO2 20 (L) 07/30/2024   GLUCOSE 84 07/30/2024   BUN 9 07/30/2024   CREATININE 0.71 07/30/2024   BILITOT 0.2 07/30/2024   ALKPHOS 71 07/30/2024   AST 24 07/30/2024   ALT 22 07/30/2024   PROT 6.2 (L) 07/30/2024   ALBUMIN 3.5 07/30/2024   CALCIUM 8.6 (L) 07/30/2024   GFRAA CANCELED 12/17/2019    Speciality Comments: No specialty comments available.  Procedures:  No procedures performed Allergies: Patient has no known allergies.   Assessment / Plan:     Visit Diagnoses: Positive ANA (antinuclear antibody) - 08/08/24: ANA 1:320NS, dsDNA-,SM-,Ro-, La-, acl-,beta-2-,ESR WNL, CRP WNL,TPO WNL,MCV-,CCP-,RF IgM 13,complements WNL, centromere-, scl-70-,RNP-,chromatin-  Polyarthralgia  Orders: No orders of the defined types were placed in this encounter.  No orders of the defined types were placed in this encounter.   Face-to-face time spent with patient was *** minutes. Greater than 50% of time was spent in counseling and coordination of care.  Follow-Up Instructions: No follow-ups on file.   Samantha CHRISTELLA Craze, PA-C  Note - This record has been created using Dragon software.  Chart creation errors have been  sought, but may not always  have been located. Such creation errors do not reflect on  the standard of medical care.     [1]  Social History Tobacco Use   Smoking status: Never    Passive exposure: Never   Smokeless tobacco: Never  Vaping Use   Vaping status: Never Used  Substance Use Topics   Alcohol use: Yes    Comment: occasionally   Drug use: Yes    Types: Marijuana    Comment: Socially   "

## 2024-10-22 ENCOUNTER — Telehealth: Admitting: Child and Adolescent Psychiatry

## 2024-10-22 DIAGNOSIS — F418 Other specified anxiety disorders: Secondary | ICD-10-CM

## 2024-10-22 DIAGNOSIS — F5105 Insomnia due to other mental disorder: Secondary | ICD-10-CM

## 2024-10-23 ENCOUNTER — Ambulatory Visit: Admitting: Physician Assistant

## 2024-10-23 DIAGNOSIS — F902 Attention-deficit hyperactivity disorder, combined type: Secondary | ICD-10-CM

## 2024-10-23 DIAGNOSIS — F649 Gender identity disorder, unspecified: Secondary | ICD-10-CM

## 2024-10-23 DIAGNOSIS — M2550 Pain in unspecified joint: Secondary | ICD-10-CM

## 2024-10-23 DIAGNOSIS — Q7963 Vascular Ehlers-Danlos syndrome: Secondary | ICD-10-CM

## 2024-10-23 DIAGNOSIS — F4323 Adjustment disorder with mixed anxiety and depressed mood: Secondary | ICD-10-CM

## 2024-10-23 DIAGNOSIS — E282 Polycystic ovarian syndrome: Secondary | ICD-10-CM

## 2024-10-23 DIAGNOSIS — F422 Mixed obsessional thoughts and acts: Secondary | ICD-10-CM

## 2024-10-23 DIAGNOSIS — F952 Tourette's disorder: Secondary | ICD-10-CM

## 2024-10-23 DIAGNOSIS — G90A Postural orthostatic tachycardia syndrome (POTS): Secondary | ICD-10-CM

## 2024-10-23 DIAGNOSIS — F99 Mental disorder, not otherwise specified: Secondary | ICD-10-CM

## 2024-10-23 DIAGNOSIS — R7689 Other specified abnormal immunological findings in serum: Secondary | ICD-10-CM

## 2024-10-23 DIAGNOSIS — Z8249 Family history of ischemic heart disease and other diseases of the circulatory system: Secondary | ICD-10-CM

## 2024-10-26 MED ORDER — TRAZODONE HCL 50 MG PO TABS: ORAL_TABLET | ORAL | 1 refills | Status: AC

## 2024-11-19 ENCOUNTER — Telehealth (HOSPITAL_COMMUNITY): Admitting: Psychiatry

## 2025-05-20 ENCOUNTER — Encounter: Admitting: Nurse Practitioner
